# Patient Record
Sex: Female | Born: 1995 | Race: Black or African American | Hispanic: No | Marital: Single | Smoking: Never smoker
Health system: Southern US, Community
[De-identification: ages and names within clinical notes are randomized; demographics above are authoritative.]

## PROBLEM LIST (undated history)

## (undated) ENCOUNTER — Inpatient Hospital Stay (HOSPITAL_COMMUNITY): Payer: Self-pay

## (undated) DIAGNOSIS — Z789 Other specified health status: Secondary | ICD-10-CM

## (undated) DIAGNOSIS — Z9889 Other specified postprocedural states: Secondary | ICD-10-CM

## (undated) DIAGNOSIS — R112 Nausea with vomiting, unspecified: Secondary | ICD-10-CM

## (undated) HISTORY — PX: NO PAST SURGERIES: SHX2092

## (undated) HISTORY — DX: Nausea with vomiting, unspecified: Z98.890

## (undated) HISTORY — DX: Nausea with vomiting, unspecified: R11.2

---
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
## 2016-01-28 ENCOUNTER — Emergency Department (HOSPITAL_COMMUNITY)
Admission: EM | Admit: 2016-01-28 | Discharge: 2016-01-28 | Disposition: A | Discharge status: Home / Self Care | Payer: Medicaid Other | Attending: Emergency Medicine | Admitting: Emergency Medicine

## 2016-01-28 ENCOUNTER — Encounter (HOSPITAL_COMMUNITY): Payer: Self-pay | Admitting: Emergency Medicine

## 2016-01-28 ENCOUNTER — Emergency Department (HOSPITAL_COMMUNITY): Payer: Medicaid Other

## 2016-01-28 DIAGNOSIS — Y929 Unspecified place or not applicable: Secondary | ICD-10-CM | POA: Diagnosis not present

## 2016-01-28 DIAGNOSIS — S6991XA Unspecified injury of right wrist, hand and finger(s), initial encounter: Secondary | ICD-10-CM | POA: Diagnosis not present

## 2016-01-28 DIAGNOSIS — Y939 Activity, unspecified: Secondary | ICD-10-CM | POA: Diagnosis not present

## 2016-01-28 DIAGNOSIS — Y999 Unspecified external cause status: Secondary | ICD-10-CM | POA: Insufficient documentation

## 2016-01-28 MED ORDER — DIAZEPAM 5 MG PO TABS: 5.0000 mg | ORAL_TABLET | Freq: Two times a day (BID) | ORAL | Status: DC

## 2016-01-28 MED ORDER — NAPROXEN 500 MG PO TABS: 500.0000 mg | ORAL_TABLET | Freq: Two times a day (BID) | ORAL | Status: DC

## 2016-01-28 MED ORDER — DIAZEPAM 5 MG PO TABS
5.0000 mg | ORAL_TABLET | Freq: Once | ORAL | Status: AC
  Administered 2016-01-28: 5 mg via ORAL
  Filled 2016-01-28: qty 1

## 2016-01-28 MED ORDER — NAPROXEN 500 MG PO TABS
500.0000 mg | ORAL_TABLET | Freq: Once | ORAL | Status: AC
  Administered 2016-01-28: 500 mg via ORAL
  Filled 2016-01-28: qty 1

## 2016-01-28 NOTE — Discharge Instructions (Signed)
[name],  Nice meeting you! Please follow-up with your primary care provider within 1-2 weeks if you continue to have pain. Return to the emergency department if you develop numbness/tingling, inability to move your finger, new/worsening symptoms. Feel better soon!  S. Lane HackerNicole Berneice Zettlemoyer, PA-C
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
## 2016-05-28 ENCOUNTER — Encounter (HOSPITAL_COMMUNITY): Payer: Self-pay | Admitting: Emergency Medicine

## 2016-05-28 ENCOUNTER — Emergency Department (HOSPITAL_COMMUNITY): Payer: Medicaid Other

## 2016-05-28 ENCOUNTER — Emergency Department (HOSPITAL_COMMUNITY)
Admission: EM | Admit: 2016-05-28 | Discharge: 2016-05-28 | Disposition: A | Discharge status: Home / Self Care | Payer: Medicaid Other | Attending: Emergency Medicine | Admitting: Emergency Medicine

## 2016-05-28 DIAGNOSIS — Y990 Civilian activity done for income or pay: Secondary | ICD-10-CM | POA: Insufficient documentation

## 2016-05-28 DIAGNOSIS — M79645 Pain in left finger(s): Secondary | ICD-10-CM | POA: Insufficient documentation

## 2016-05-28 DIAGNOSIS — Y939 Activity, unspecified: Secondary | ICD-10-CM | POA: Diagnosis not present

## 2016-05-28 DIAGNOSIS — X509XXA Other and unspecified overexertion or strenuous movements or postures, initial encounter: Secondary | ICD-10-CM | POA: Diagnosis not present

## 2016-05-28 DIAGNOSIS — Y929 Unspecified place or not applicable: Secondary | ICD-10-CM | POA: Diagnosis not present

## 2016-05-28 DIAGNOSIS — G8929 Other chronic pain: Secondary | ICD-10-CM | POA: Diagnosis not present

## 2016-05-28 DIAGNOSIS — M79644 Pain in right finger(s): Secondary | ICD-10-CM

## 2016-05-28 MED ORDER — IBUPROFEN 200 MG PO TABS
600.0000 mg | ORAL_TABLET | Freq: Once | ORAL | Status: AC
  Administered 2016-05-28: 600 mg via ORAL
  Filled 2016-05-28: qty 3

## 2016-05-28 NOTE — Discharge Instructions (Signed)
Take Ibuprofen 600mg  three times daily Follow up with hand surgeon to evaluate

## 2016-05-28 NOTE — ED Provider Notes (Signed)
[facility]-EMERGENCY DEPT Provider Note   CSN: [id number] Arrival date & time: 05/28/16  1750  By signing my name below, I, Mackenzie Lambert, attest that this documentation has been prepared under the direction and in the presence of non-physician practitioner, Terance HartKelly Hervey Wedig, PA-C. Electronically Signed: Modena JanskyAlbert Lambert, Scribe. 05/28/2016. 8:09 PM.  History   Chief Complaint Chief Complaint  Patient presents with  . thumb pain    left    The history is provided by the patient. No language interpreter was used.   HPI Comments: Mackenzie Lambert is a 20 y.o. female who presents to the Emergency Department complaining of left thumb pain that started today. She states she had a splint for a previous injury in May and was instructed to keep it on for 2 weeks however only worse it for 4 days at that time since she had to work. Over the past several months she has had chronic left thumb pain exacerbated by her job which involves lifting. Today she experienced acute pain after on object at work fell which she tried to catch and it pushed her thumb backwards. She describes the pain as a throbbing sensation that is relieved some by tylenol. She is right handed. Denies any other complaints.   Past Medical History:  Diagnosis Date  . Medical history non-contributory     There are no active problems to display for this patient.   Past Surgical History:  Procedure Laterality Date  . NO PAST SURGERIES      OB History    Gravida Para Term Preterm AB Living   1             SAB TAB Ectopic Multiple Live Births                   Home Medications    Prior to Admission medications   Medication Sig Start Date End Date Taking? Authorizing Provider  diazepam (VALIUM) 5 MG tablet Take 1 tablet (5 mg total) by mouth 2 (two) times daily. 01/28/16   Melton KrebsSamantha Nicole Riley, PA-C  naproxen (NAPROSYN) 500 MG tablet Take 1 tablet (500 mg total) by mouth 2 (two) times daily. 01/28/16   Melton KrebsSamantha Nicole Riley, PA-C    norgestimate-ethinyl estradiol (ORTHO-CYCLEN,SPRINTEC,PREVIFEM) 0.25-35 MG-MCG tablet Take 1 tablet by mouth daily. 08/21/15   Judeth HornErin Lawrence, NP    Family History No family history on file.  Social History Social History  Substance Use Topics  . Smoking status: Never Smoker  . Smokeless tobacco: Never Used  . Alcohol use No     Allergies   Review of patient's allergies indicates no known allergies.   Review of Systems Review of Systems  Constitutional: Negative for fever.  Musculoskeletal: Positive for arthralgias. Myalgias: Left thumb.     Physical Exam Updated Vital Signs BP 106/67 (BP Location: Left Arm)   Pulse 85   Temp 98 F (36.7 C) (Oral)   Resp 13   Ht 5\' 4"  (1.626 m)   Wt 117 lb (53.1 kg)   LMP 04/16/2016   SpO2 99%   BMI 20.08 kg/m   Physical Exam  Constitutional: She is oriented to person, place, and time. She appears well-developed and well-nourished. No distress.  HENT:  Head: Normocephalic and atraumatic.  Eyes: Conjunctivae are normal. Pupils are equal, round, and reactive to light. Right eye exhibits no discharge. Left eye exhibits no discharge. No scleral icterus.  Neck: Normal range of motion. Neck supple.  Cardiovascular: Normal rate and regular rhythm.  No murmur heard. Pulmonary/Chest: Effort normal and breath sounds normal. No respiratory distress.  Abdominal: Soft. She exhibits no distension. There is no tenderness.  Musculoskeletal: She exhibits no edema.  Left hand: No obvious swelling or deformity. Holding her thumb in flexion. Significant tenderness to palpation with ROM of thumb. N/V intact.   Neurological: She is alert and oriented to person, place, and time.  Skin: Skin is warm and dry.  Psychiatric: She has a normal mood and affect. Her behavior is normal.  Nursing note and vitals reviewed.    ED Treatments / Results  DIAGNOSTIC STUDIES: Oxygen Saturation is 99% on RA, normal by my interpretation.    COORDINATION OF  CARE: 8:14 PM- Pt advised of plan for treatment and pt agrees.  Labs (all labs ordered are listed, but only abnormal results are displayed) Labs Reviewed - No data to display  EKG  EKG Interpretation None       Radiology Dg Finger Thumb Right  Result Date: 05/28/2016 CLINICAL DATA:  Pt reports second injury on left thumb, sts was given splint previous injury to keep 2 weeks, yet was taken off after 4 days, toady she sts injured again picking an object up at work. Unable to move left thumb, mild edema noted. EXAM: RIGHT THUMB 2+V COMPARISON:  01/28/2016 FINDINGS: No acute fracture. There is a concave depression along the dorsal radial aspect of the first metacarpal head which was not present on the prior exam. This may reflect the sequela of a subacute to older avulsion fracture. The joints are normally spaced and aligned. Soft tissues are unremarkable. IMPRESSION: 1. Small defect noted along the dorsal radial aspect of the first metacarpal head. Suspect a subacute to early chronic avulsion fracture, which was not evident on the prior radiographs. Electronically Signed   By: Amie Portland M.D.   On: 05/28/2016 19:37    Procedures Procedures (including critical care time)  Medications Ordered in ED Medications  ibuprofen (ADVIL,MOTRIN) tablet 600 mg (600 mg Oral Given 05/28/16 2022)    Initial Impression / Assessment and Plan / ED Course  I have reviewed the triage vital signs and the nursing notes.  Pertinent labs & imaging results that were available during my care of the patient were reviewed by me and considered in my medical decision making (see chart for details).  Clinical Course   20 year old female presents with left thumb pain after acute injury. Xray negative for acute fracture but does show possible chronic avulsion fx. Will treat for ulnar collateral ligament sprain. Patient placed in thumb spica and will have her follow up with Ortho. Pain treated in ED. Recommend course  of NSAIDs. Patient is NAD, non-toxic, with stable VS. Patient is informed of clinical course, understands medical decision making process, and agrees with plan. Opportunity for questions provided and all questions answered. Return precautions given.   Final Clinical Impressions(s) / ED Diagnoses   Final diagnoses:  Thumb pain, right    New Prescriptions Discharge Medication List as of 05/28/2016  9:05 PM     I personally performed the services described in this documentation, which was scribed in my presence. The recorded information has been reviewed and is accurate.     Bethel Born, PA-C 06/01/16 1742    Pricilla Loveless, MD 06/02/16 [phone]

## 2016-05-28 NOTE — ED Triage Notes (Signed)
Pt reports second injury on left thumb, sts was given splint  previous injury to keep 2 weeks, yet was taken off after 4 days, toady she sts injured again. Unable to move left thumb, mild edema noted.

## 2016-05-28 NOTE — ED Notes (Signed)
Patient was alert, oriented and stable upon discharge. RN went over AVS and patient had no further questions.  

## 2016-06-06 ENCOUNTER — Other Ambulatory Visit: Payer: Self-pay | Admitting: Orthopedic Surgery

## 2016-06-06 DIAGNOSIS — S6991XA Unspecified injury of right wrist, hand and finger(s), initial encounter: Secondary | ICD-10-CM

## 2016-06-25 ENCOUNTER — Other Ambulatory Visit: Payer: Medicaid Other

## 2017-02-07 ENCOUNTER — Inpatient Hospital Stay (HOSPITAL_COMMUNITY)
Admission: AD | Admit: 2017-02-07 | Discharge: 2017-02-07 | Disposition: A | Discharge status: Home / Self Care | Payer: Medicaid Other | Source: Ambulatory Visit | Attending: Obstetrics & Gynecology | Admitting: Obstetrics & Gynecology

## 2017-02-07 ENCOUNTER — Encounter (HOSPITAL_COMMUNITY): Payer: Self-pay

## 2017-02-07 DIAGNOSIS — N760 Acute vaginitis: Secondary | ICD-10-CM | POA: Insufficient documentation

## 2017-02-07 DIAGNOSIS — N926 Irregular menstruation, unspecified: Secondary | ICD-10-CM | POA: Insufficient documentation

## 2017-02-07 DIAGNOSIS — B9689 Other specified bacterial agents as the cause of diseases classified elsewhere: Secondary | ICD-10-CM | POA: Diagnosis not present

## 2017-02-07 LAB — URINALYSIS, ROUTINE W REFLEX MICROSCOPIC
Bilirubin Urine: NEGATIVE
Glucose, UA: NEGATIVE mg/dL
HGB URINE DIPSTICK: NEGATIVE
Ketones, ur: NEGATIVE mg/dL
Leukocytes, UA: NEGATIVE
Nitrite: NEGATIVE
PH: 6 (ref 5.0–8.0)
Protein, ur: NEGATIVE mg/dL
SPECIFIC GRAVITY, URINE: 1.015 (ref 1.005–1.030)

## 2017-02-07 LAB — WET PREP, GENITAL
SPERM: NONE SEEN
Trich, Wet Prep: NONE SEEN
YEAST WET PREP: NONE SEEN

## 2017-02-07 LAB — POCT PREGNANCY, URINE: PREG TEST UR: NEGATIVE

## 2017-02-07 MED ORDER — METRONIDAZOLE 500 MG PO TABS: 500.0000 mg | ORAL_TABLET | Freq: Two times a day (BID) | ORAL | 0 refills | Status: DC

## 2017-02-07 NOTE — MAU Provider Note (Signed)
History     CSN: [id number]  Arrival date and time: 02/07/17 [id number]   First Provider Initiated Contact with Patient 02/07/17 0920      Chief Complaint  Patient presents with  . Menstrual Problem   G2P0020 Non-pregnant female here with irregular menses and vaginal discharge. Vaginal discharge occurred last month during the menses and was green color w/o odor. No new partner in 1 year. Not using contraception would like to get pregnant again but is unsure if it is safe d/t her hx. Reports miscarriage last year when she was 5 months, started bleeding, passed fetus in toilet, didn't tell anyone except her boyfriend. Irregular menses have ensued since. Some months she bleeds two times and other months she skips her period. Before the miscarriage she had cramping just before menses started but now she no longer has this sx. She wants to make sure she is still able to conceive.    Pertinent Gynecological History: Menses: 01/23/17 Bleeding: dysfunctional uterine bleeding Contraception: none Sexually transmitted diseases: no past history   Past Medical History:  Diagnosis Date  . Medical history non-contributory     Past Surgical History:  Procedure Laterality Date  . NO PAST SURGERIES      History reviewed. No pertinent family history.  Social History  Substance Use Topics  . Smoking status: Never Smoker  . Smokeless tobacco: Never Used  . Alcohol use No    Allergies: No Known Allergies  No prescriptions prior to admission.    Review of Systems  Gastrointestinal: Negative for abdominal pain.  Genitourinary: Positive for vaginal discharge.   Physical Exam   Blood pressure 129/83, pulse 91, temperature 98.8 F (37.1 C), temperature source Oral, resp. rate 18, height 5\' 1"  (1.549 m), weight 51.3 kg (113 lb), last menstrual period 01/23/2017, SpO2 99 %.  Physical Exam  Constitutional: She is oriented to person, place, and time. She appears well-developed and well-nourished.  No distress.  HENT:  Head: Normocephalic and atraumatic.  Neck: Normal range of motion.  Respiratory: Effort normal. No respiratory distress.  GI: Soft. She exhibits no distension and no mass. There is no tenderness. There is no rebound and no guarding.  Genitourinary:  Genitourinary Comments: External: no lesions or erythema Vagina: rugated, pink, moist, thin white frothy discharge Uterus: non enlarged, anteverted, non tender, no CMT Adnexae: no masses, no tenderness left, no tenderness right   Musculoskeletal: Normal range of motion.  Neurological: She is alert and oriented to person, place, and time.  Skin: Skin is warm and dry.  Psychiatric: She has a normal mood and affect.   Results for orders placed or performed during the hospital encounter of 02/07/17 (from the past 24 hour(s))  Urinalysis, Routine w reflex microscopic     Status: None   Collection Time: 02/07/17  8:41 AM  Result Value Ref Range   Color, Urine YELLOW YELLOW   APPearance CLEAR CLEAR   Specific Gravity, Urine 1.015 1.005 - 1.030   pH 6.0 5.0 - 8.0   Glucose, UA NEGATIVE NEGATIVE mg/dL   Hgb urine dipstick NEGATIVE NEGATIVE   Bilirubin Urine NEGATIVE NEGATIVE   Ketones, ur NEGATIVE NEGATIVE mg/dL   Protein, ur NEGATIVE NEGATIVE mg/dL   Nitrite NEGATIVE NEGATIVE   Leukocytes, UA NEGATIVE NEGATIVE  Pregnancy, urine POC     Status: None   Collection Time: 02/07/17  8:59 AM  Result Value Ref Range   Preg Test, Ur NEGATIVE NEGATIVE  Wet prep, genital     Status: Abnormal  Collection Time: 02/07/17  9:35 AM  Result Value Ref Range   Yeast Wet Prep HPF POC NONE SEEN NONE SEEN   Trich, Wet Prep NONE SEEN NONE SEEN   Clue Cells Wet Prep HPF POC PRESENT (A) NONE SEEN   WBC, Wet Prep HPF POC FEW (A) NONE SEEN   Sperm NONE SEEN    MAU Course  Procedures  MDM Labs ordered and reviewed. No evidence of pregnancy. Recommend outpt f/u to establish care and if AUB persists. Start PNV daily-explained rationale.  Explained to pt that based on today's exam I don't see any contraindication to attempting pregnancy again. It's unclear why she had a second trimester loss as there could be multiple causes and it's unclear her gestational age at the time of the loss as she cannot recall when the miscarriage occurred. Will treat BV. She is stable for discharge home.   Assessment and Plan   1. Bacterial vaginosis   2. Irregular menses    Discharge home Follow up with OBGYN provider of choice as needed-list provided Rx Flagyl Start PNV 1 po daily  Allergies as of 02/07/2017   No Known Allergies     Medication List    TAKE these medications   metroNIDAZOLE 500 MG tablet Commonly known as:  FLAGYL Take 1 tablet (500 mg total) by mouth 2 (two) times daily.      Donette Larry, CNM 02/07/2017, 9:26 AM

## 2017-02-07 NOTE — Discharge Instructions (Signed)
Bacterial Vaginosis Bacterial vaginosis is a vaginal infection that occurs when the normal balance of bacteria in the vagina is disrupted. It results from an overgrowth of certain bacteria. This is the most common vaginal infection among women ages 15-44. Because bacterial vaginosis increases your risk for STIs (sexually transmitted infections), getting treated can help reduce your risk for chlamydia, gonorrhea, herpes, and HIV (human immunodeficiency virus). Treatment is also important for preventing complications in pregnant women, because this condition can cause an early (premature) delivery. What are the causes? This condition is caused by an increase in harmful bacteria that are normally present in small amounts in the vagina. However, the reason that the condition develops is not fully understood. What increases the risk? The following factors may make you more likely to develop this condition:  Having a new sexual partner or multiple sexual partners.  Having unprotected sex.  Douching.  Having an intrauterine device (IUD).  Smoking.  Drug and alcohol abuse.  Taking certain antibiotic medicines.  Being pregnant.  You cannot get bacterial vaginosis from toilet seats, bedding, swimming pools, or contact with objects around you. What are the signs or symptoms? Symptoms of this condition include:  Grey or white vaginal discharge. The discharge can also be watery or foamy.  A fish-like odor with discharge, especially after sexual intercourse or during menstruation.  Itching in and around the vagina.  Burning or pain with urination.  Some women with bacterial vaginosis have no signs or symptoms. How is this diagnosed? This condition is diagnosed based on:  Your medical history.  A physical exam of the vagina.  Testing a sample of vaginal fluid under a microscope to look for a large amount of bad bacteria or abnormal cells. Your health care provider may use a cotton swab  or a small wooden spatula to collect the sample.  How is this treated? This condition is treated with antibiotics. These may be given as a pill, a vaginal cream, or a medicine that is put into the vagina (suppository). If the condition comes back after treatment, a second round of antibiotics may be needed. Follow these instructions at home: Medicines  Take over-the-counter and prescription medicines only as told by your health care provider.  Take or use your antibiotic as told by your health care provider. Do not stop taking or using the antibiotic even if you start to feel better. General instructions  If you have a female sexual partner, tell her that you have a vaginal infection. She should see her health care provider and be treated if she has symptoms. If you have a female sexual partner, he does not need treatment.  During treatment: ? Avoid sexual activity until you finish treatment. ? Do not douche. ? Avoid alcohol as directed by your health care provider. ? Avoid breastfeeding as directed by your health care provider.  Drink enough water and fluids to keep your urine clear or pale yellow.  Keep the area around your vagina and rectum clean. ? Wash the area daily with warm water. ? Wipe yourself from front to back after using the toilet.  Keep all follow-up visits as told by your health care provider. This is important. How is this prevented?  Do not douche.  Wash the outside of your vagina with warm water only.  Use protection when having sex. This includes latex condoms and dental dams.  Limit how many sexual partners you have. To help prevent bacterial vaginosis, it is best to have sex with just   one partner (monogamous).  Make sure you and your sexual partner are tested for STIs.  Wear cotton or cotton-lined underwear.  Avoid wearing tight pants and pantyhose, especially during summer.  Limit the amount of alcohol that you drink.  Do not use any products that  contain nicotine or tobacco, such as cigarettes and e-cigarettes. If you need help quitting, ask your health care provider.  Do not use illegal drugs. Where to find more information:  Centers for Disease Control and Prevention: www.cdc.gov/std  American Sexual Health Association (ASHA): www.ashastd.org  [place].S. Department of Health and Human Services, Office on Women's Health: www.womenshealth.gov/ or https://www.womenshealth.gov/a-z-topics/bacterial-vaginosis Contact a health care provider if:  Your symptoms do not improve, even after treatment.  You have more discharge or pain when urinating.  You have a fever.  You have pain in your abdomen.  You have pain during sex.  You have vaginal bleeding between periods. Summary  Bacterial vaginosis is a vaginal infection that occurs when the normal balance of bacteria in the vagina is disrupted.  Because bacterial vaginosis increases your risk for STIs (sexually transmitted infections), getting treated can help reduce your risk for chlamydia, gonorrhea, herpes, and HIV (human immunodeficiency virus). Treatment is also important for preventing complications in pregnant women, because the condition can cause an early (premature) delivery.  This condition is treated with antibiotic medicines. These may be given as a pill, a vaginal cream, or a medicine that is put into the vagina (suppository). This information is not intended to replace advice given to you by your health care provider. Make sure you discuss any questions you have with your health care provider. Document Released: 08/18/2005 Document Revised: 05/03/2016 Document Reviewed: 05/03/2016 Elsevier Interactive Patient Education  2017 [employer]  

## 2017-02-07 NOTE — MAU Note (Signed)
Pt states 6 months ago she had a miscarriage when she was around 5 months pregnant. Pt states after that happened she felt depressed and didn't go back to the doctor. Pt states since that time her period has been a lot shorter than normal. Pt states her period used to be 7 days long and now it is 3 days. Pt states her period has also been irregular. One month she had two periods and sometimes it is longer than normal between periods. Pt denies pain.

## 2017-02-09 LAB — GC/CHLAMYDIA PROBE AMP (~~LOC~~) NOT AT ARMC
CHLAMYDIA, DNA PROBE: NEGATIVE
Neisseria Gonorrhea: NEGATIVE

## 2017-02-22 ENCOUNTER — Inpatient Hospital Stay (HOSPITAL_COMMUNITY)
Admission: AD | Admit: 2017-02-22 | Discharge: 2017-02-22 | Disposition: A | Discharge status: Home / Self Care | Payer: Medicaid Other | Source: Ambulatory Visit | Attending: Obstetrics & Gynecology | Admitting: Obstetrics & Gynecology

## 2017-02-22 ENCOUNTER — Encounter (HOSPITAL_COMMUNITY): Payer: Self-pay | Admitting: *Deleted

## 2017-02-22 DIAGNOSIS — N911 Secondary amenorrhea: Secondary | ICD-10-CM

## 2017-02-22 DIAGNOSIS — N912 Amenorrhea, unspecified: Secondary | ICD-10-CM | POA: Diagnosis not present

## 2017-02-22 NOTE — Discharge Instructions (Signed)

## 2017-02-22 NOTE — MAU Provider Note (Signed)
[name] is a 21 y.o. G2P0020 at Unknown who presents to MAU today for pregnancy verification. The patient denies abdominal pain or vaginal bleeding today. Pos home UPT.  BP 112/60 (BP Location: Right Arm)   Pulse 86   Temp 98.5 F (36.9 C) (Oral)   Resp 16   Wt 114 lb 12 oz (52.1 kg)   LMP 01/23/2017 (Exact Date)   SpO2 100%   BMI 21.68 kg/m   CONSTITUTIONAL: Well-developed, well-nourished female in no acute distress.  CARDIOVASCULAR: Regular heart rate RESPIRATORY: Normal effort NEUROLOGICAL: Alert and oriented to person, place, and time.  SKIN: Skin is warm and dry. No rash noted. Not diaphoretic. No erythema. No pallor. PSYCH: Normal mood and affect. Normal behavior. Normal judgment and thought content.  MDM Medical Screen Exam Complete  A: Amenorrhea  P: Discharge from MAU Patient advised to follow-up with [facility] for pregnancy confirmation  Monday-Thursday 8am-4pm or Friday 8am-11am Patient may return to MAU as needed or if her condition were to change or worsen   Katrinka BlazingSmith, [place], [place]  02/22/2017 12:57 PM

## 2017-02-22 NOTE — MAU Note (Signed)
Was here on 6/9, had 2 periods in May, ? Spotting on the 25th.  Has a period tracker, said my period was late, did a HPT and it was +. Wanting to know if she is pregnant. Has been cramping, felt like period was going to start and it hasn't.  No pain today.

## 2017-02-24 ENCOUNTER — Inpatient Hospital Stay (HOSPITAL_COMMUNITY)
Admission: AD | Admit: 2017-02-24 | Discharge: 2017-02-24 | Disposition: A | Discharge status: Home / Self Care | Payer: Medicaid Other | Source: Ambulatory Visit | Attending: Family Medicine | Admitting: Family Medicine

## 2017-02-24 ENCOUNTER — Inpatient Hospital Stay (HOSPITAL_COMMUNITY): Payer: Medicaid Other

## 2017-02-24 ENCOUNTER — Encounter: Payer: Self-pay | Admitting: Medical

## 2017-02-24 DIAGNOSIS — M545 Low back pain: Secondary | ICD-10-CM | POA: Insufficient documentation

## 2017-02-24 DIAGNOSIS — O9989 Other specified diseases and conditions complicating pregnancy, childbirth and the puerperium: Secondary | ICD-10-CM

## 2017-02-24 DIAGNOSIS — R109 Unspecified abdominal pain: Secondary | ICD-10-CM | POA: Diagnosis not present

## 2017-02-24 DIAGNOSIS — R1030 Lower abdominal pain, unspecified: Secondary | ICD-10-CM | POA: Diagnosis not present

## 2017-02-24 DIAGNOSIS — O3680X0 Pregnancy with inconclusive fetal viability, not applicable or unspecified: Secondary | ICD-10-CM

## 2017-02-24 DIAGNOSIS — O26891 Other specified pregnancy related conditions, first trimester: Secondary | ICD-10-CM | POA: Diagnosis not present

## 2017-02-24 DIAGNOSIS — O99891 Other specified diseases and conditions complicating pregnancy: Secondary | ICD-10-CM

## 2017-02-24 DIAGNOSIS — Z3A01 Less than 8 weeks gestation of pregnancy: Secondary | ICD-10-CM | POA: Insufficient documentation

## 2017-02-24 DIAGNOSIS — M549 Dorsalgia, unspecified: Secondary | ICD-10-CM

## 2017-02-24 LAB — WET PREP, GENITAL
SPERM: NONE SEEN
TRICH WET PREP: NONE SEEN
YEAST WET PREP: NONE SEEN

## 2017-02-24 LAB — URINALYSIS, ROUTINE W REFLEX MICROSCOPIC
Bilirubin Urine: NEGATIVE
GLUCOSE, UA: NEGATIVE mg/dL
Hgb urine dipstick: NEGATIVE
KETONES UR: 5 mg/dL — AB
LEUKOCYTES UA: NEGATIVE
Nitrite: NEGATIVE
PH: 5 (ref 5.0–8.0)
PROTEIN: NEGATIVE mg/dL
Specific Gravity, Urine: 1.02 (ref 1.005–1.030)

## 2017-02-24 LAB — CBC
HEMATOCRIT: 42 % (ref 36.0–46.0)
Hemoglobin: 13.7 g/dL (ref 12.0–15.0)
MCH: 29.4 pg (ref 26.0–34.0)
MCHC: 32.6 g/dL (ref 30.0–36.0)
MCV: 90.1 fL (ref 78.0–100.0)
Platelets: 291 10*3/uL (ref 150–400)
RBC: 4.66 MIL/uL (ref 3.87–5.11)
RDW: 13 % (ref 11.5–15.5)
WBC: 9.8 10*3/uL (ref 4.0–10.5)

## 2017-02-24 LAB — HCG, QUANTITATIVE, PREGNANCY: hCG, Beta Chain, Quant, S: 298 m[IU]/mL — ABNORMAL HIGH (ref <5)

## 2017-02-24 LAB — POCT PREGNANCY, URINE: Preg Test, Ur: POSITIVE — AB

## 2017-02-24 NOTE — MAU Provider Note (Signed)
History     CSN: [id number]  Arrival date and time: 02/24/17 1639   None     Chief Complaint  Patient presents with  . Back Pain  . Abdominal Pain   HPI [name] is a 21 y.o. G3P0020 at [redacted]w[redacted]d who presents to MAU today with complaint of back pain and lower abdominal pain. Pain started this week and has been intermittent. The patient denies vaginal bleeding, discharge, UTI symptoms or N/V/D. She has not taken anything for pain.   OB History    Gravida Para Term Preterm AB Living   3       2     SAB TAB Ectopic Multiple Live Births   2       0      Past Medical History:  Diagnosis Date  . Medical history non-contributory     Past Surgical History:  Procedure Laterality Date  . NO PAST SURGERIES      No family history on file.  Social History  Substance Use Topics  . Smoking status: Never Smoker  . Smokeless tobacco: Never Used  . Alcohol use No    Allergies: No Known Allergies  No prescriptions prior to admission.    Review of Systems  Constitutional: Negative for fever.  Gastrointestinal: Positive for abdominal pain. Negative for constipation, diarrhea, nausea and vomiting.  Genitourinary: Negative for dysuria, frequency, urgency, vaginal bleeding and vaginal discharge.  Musculoskeletal: Positive for back pain.   Physical Exam   Blood pressure (!) 120/58, pulse 83, temperature 98.7 F (37.1 C), temperature source Oral, resp. rate 17, height 5\' 1"  (1.549 m), weight 115 lb (52.2 kg), last menstrual period 01/23/2017.  Physical Exam  Nursing note and vitals reviewed. Constitutional: She is oriented to person, place, and time. She appears well-developed and well-nourished. No distress.  HENT:  Head: Normocephalic and atraumatic.  Cardiovascular: Normal rate.   Respiratory: Effort normal.  GI: Soft. She exhibits no distension and no mass. There is no tenderness. There is no rebound and no guarding.  Genitourinary: Uterus is not enlarged and  not tender. Cervix exhibits no motion tenderness and no discharge. Right adnexum displays no mass and no tenderness. Left adnexum displays no mass and no tenderness. No bleeding in the vagina. No vaginal discharge found.  Neurological: She is alert and oriented to person, place, and time.  Skin: Skin is warm and dry. No erythema.  Psychiatric: She has a normal mood and affect.    Results for orders placed or performed during the hospital encounter of 02/24/17 (from the past 24 hour(s))  Urinalysis, Routine w reflex microscopic     Status: Abnormal   Collection Time: 02/24/17  5:20 PM  Result Value Ref Range   Color, Urine YELLOW YELLOW   APPearance CLEAR CLEAR   Specific Gravity, Urine 1.020 1.005 - 1.030   pH 5.0 5.0 - 8.0   Glucose, UA NEGATIVE NEGATIVE mg/dL   Hgb urine dipstick NEGATIVE NEGATIVE   Bilirubin Urine NEGATIVE NEGATIVE   Ketones, ur 5 (A) NEGATIVE mg/dL   Protein, ur NEGATIVE NEGATIVE mg/dL   Nitrite NEGATIVE NEGATIVE   Leukocytes, UA NEGATIVE NEGATIVE  Pregnancy, urine POC     Status: Abnormal   Collection Time: 02/24/17  5:33 PM  Result Value Ref Range   Preg Test, Ur POSITIVE (A) NEGATIVE  CBC     Status: None   Collection Time: 02/24/17  7:56 PM  Result Value Ref Range   WBC 9.8 4.0 -  10.5 K/uL   RBC 4.66 3.87 - 5.11 MIL/uL   Hemoglobin 13.7 12.0 - 15.0 g/dL   HCT 16.142.0 09.636.0 - 04.546.0 %   MCV 90.1 78.0 - 100.0 fL   MCH 29.4 26.0 - 34.0 pg   MCHC 32.6 30.0 - 36.0 g/dL   RDW 40.913.0 81.111.5 - 91.415.5 %   Platelets 291 150 - 400 K/uL  hCG, quantitative, pregnancy     Status: Abnormal   Collection Time: 02/24/17  7:56 PM  Result Value Ref Range   hCG, Beta Chain, Quant, S 298 (H) <5 mIU/mL  Wet prep, genital     Status: Abnormal   Collection Time: 02/24/17 11:10 PM  Result Value Ref Range   Yeast Wet Prep HPF POC NONE SEEN NONE SEEN   Trich, Wet Prep NONE SEEN NONE SEEN   Clue Cells Wet Prep HPF POC PRESENT (A) NONE SEEN   WBC, Wet Prep HPF POC FEW (A) NONE SEEN    Sperm NONE SEEN    [place] Ob Comp Less 14 Wks  Addendum Date: 02/24/2017   ADDENDUM REPORT: 02/24/2017 23:26 ADDENDUM: Dictation error with correction to section "FINDINGS" as follows: Intrauterine gestational sac: None Yolk sac:  None Embryo:  None Cardiac Activity:  N/A Electronically Signed   By: Mitzi HansenLance  Furusawa-Stratton M.D.   On: 02/24/2017 23:26   Result Date: 02/24/2017 CLINICAL DATA:  Right lower back pain beginning at work today. EXAM: OBSTETRIC <14 WK US AND TRANSVAGINAL OB US TECHNIQUE: Both transabdominal and transvaginal ultrasound examinations were performed for complete evaluation of the gestation as well as the maternal uterus, adnexal regions, and pelvic cul-de-sac. Transvaginal technique was performed to assess early pregnancy. COMPARISON:  05/08/2015 pelvic ultrasound FINDINGS: Intrauterine gestational sac: None Yolk sac:  Visualized. Embryo:  Visualized. Cardiac Activity: Visualized. Maternal uterus/adnexae: Endometrium is thickened to 12 mm and mildly heterogeneous. Bilateral ovaries are normal in appearance. Right ovary corpus luteum cyst. No adnexal mass identified. Other: There is a small volume of anechoic simple fluid within the pelvis, probably physiologic. IMPRESSION: Pregnancy of unknown location. Intrauterine pregnancy may not be identified with a beta HCG of less than 3,000. Follow-up beta HCG and possible pelvic ultrasound as clinically indicated is recommended to evaluate pregnancy location. This recommendation follows SRU consensus guidelines: Diagnostic Criteria for Nonviable Pregnancy Early in the First Trimester. Malva Limes Engl J Med 2013; 782:9562-13; 369:1443-51. Electronically Signed: By: Mitzi HansenLance  Furusawa-Stratton M.D. On: 02/24/2017 23:12   [place] Ob Transvaginal  Addendum Date: 02/24/2017   ADDENDUM REPORT: 02/24/2017 23:26 ADDENDUM: Dictation error with correction to section "FINDINGS" as follows: Intrauterine gestational sac: None Yolk sac:  None Embryo:  None Cardiac Activity:  N/A  Electronically Signed   By: Mitzi HansenLance  Furusawa-Stratton M.D.   On: 02/24/2017 23:26   Result Date: 02/24/2017 CLINICAL DATA:  Right lower back pain beginning at work today. EXAM: OBSTETRIC <14 WK US AND TRANSVAGINAL OB US TECHNIQUE: Both transabdominal and transvaginal ultrasound examinations were performed for complete evaluation of the gestation as well as the maternal uterus, adnexal regions, and pelvic cul-de-sac. Transvaginal technique was performed to assess early pregnancy. COMPARISON:  05/08/2015 pelvic ultrasound FINDINGS: Intrauterine gestational sac: None Yolk sac:  Visualized. Embryo:  Visualized. Cardiac Activity: Visualized. Maternal uterus/adnexae: Endometrium is thickened to 12 mm and mildly heterogeneous. Bilateral ovaries are normal in appearance. Right ovary corpus luteum cyst. No adnexal mass identified. Other: There is a small volume of anechoic simple fluid within the pelvis, probably physiologic. IMPRESSION: Pregnancy of unknown location. Intrauterine pregnancy may not  be identified with a beta HCG of less than 3,000. Follow-up beta HCG and possible pelvic ultrasound as clinically indicated is recommended to evaluate pregnancy location. This recommendation follows SRU consensus guidelines: Diagnostic Criteria for Nonviable Pregnancy Early in the First Trimester. Malva Limes Med 2013; 562:1308-65. Electronically Signed: By: Mitzi Hansen M.D. On: 02/24/2017 23:12    MAU Course  Procedures None  MDM +UPT UA, wet prep, GC/chlamydia, CBC, ABO/Rh, quant hCG, HIV, RPR and [place] today to rule out ectopic pregnancy B+ blood type in Epic from previous visit  Assessment and Plan  A: Pregnancy of unknown location Abdominal pain in pregnancy, first trimester  P: Discharge home Ectopic precautions discussed Patient advised to follow-up with [facility] on Friday at 8:00 am for repeat hCG  Patient may return to MAU as needed or if her condition were to change or worsen   Vonzella Nipple,  PA-C 02/25/2017, 1:04 AM

## 2017-02-24 NOTE — MAU Note (Signed)
Pt started having back pain at work today around 1500.  Went to health & safety dept @ work but they told her they couldn't treat her because she is pregnant.  Having some lower abd pain as well, denies bleeding.  Pos HPT 2 days ago.

## 2017-02-24 NOTE — Discharge Instructions (Signed)
First Trimester of Pregnancy The first trimester of pregnancy is from week 1 until the end of week 13 (months 1 through 3). During this time, your baby will begin to develop inside you. At 6-8 weeks, the eyes and face are formed, and the heartbeat can be seen on ultrasound. At the end of 12 weeks, all the baby's organs are formed. Prenatal care is all the medical care you receive before the birth of your baby. Make sure you get good prenatal care and follow all of your doctor's instructions. Follow these instructions at home: Medicines  Take over-the-counter and prescription medicines only as told by your doctor. Some medicines are safe and some medicines are not safe during pregnancy.  Take a prenatal vitamin that contains at least 600 micrograms (mcg) of folic acid.  If you have trouble pooping (constipation), take medicine that will make your stool soft (stool softener) if your doctor approves. Eating and drinking  Eat regular, healthy meals.  Your doctor will tell you the amount of weight gain that is right for you.  Avoid raw meat and uncooked cheese.  If you feel sick to your stomach (nauseous) or throw up (vomit): ? Eat 4 or 5 small meals a day instead of 3 large meals. ? Try eating a few soda crackers. ? Drink liquids between meals instead of during meals.  To prevent constipation: ? Eat foods that are high in fiber, like fresh fruits and vegetables, whole grains, and beans. ? Drink enough fluids to keep your pee (urine) clear or pale yellow. Activity  Exercise only as told by your doctor. Stop exercising if you have cramps or pain in your lower belly (abdomen) or low back.  Do not exercise if it is too hot, too humid, or if you are in a place of great height (high altitude).  Try to avoid standing for long periods of time. Move your legs often if you must stand in one place for a long time.  Avoid heavy lifting.  Wear low-heeled shoes. Sit and stand up straight.  You  can have sex unless your doctor tells you not to. Relieving pain and discomfort  Wear a good support bra if your breasts are sore.  Take warm water baths (sitz baths) to soothe pain or discomfort caused by hemorrhoids. Use hemorrhoid cream if your doctor says it is okay.  Rest with your legs raised if you have leg cramps or low back pain.  If you have puffy, bulging veins (varicose veins) in your legs: ? Wear support hose or compression stockings as told by your doctor. ? Raise (elevate) your feet for 15 minutes, 3-4 times a day. ? Limit salt in your food. Prenatal care  Schedule your prenatal visits by the twelfth week of pregnancy.  Write down your questions. Take them to your prenatal visits.  Keep all your prenatal visits as told by your doctor. This is important. Safety  Wear your seat belt at all times when driving.  Make a list of emergency phone numbers. The list should include numbers for family, friends, the hospital, and police and fire departments. General instructions  Ask your doctor for a referral to a local prenatal class. Begin classes no later than at the start of month 6 of your pregnancy.  Ask for help if you need counseling or if you need help with nutrition. Your doctor can give you advice or tell you where to go for help.  Do not use hot tubs, steam rooms, or   saunas.  Do not douche or use tampons or scented sanitary pads.  Do not cross your legs for long periods of time.  Avoid all herbs and alcohol. Avoid drugs that are not approved by your doctor.  Do not use any tobacco products, including cigarettes, chewing tobacco, and electronic cigarettes. If you need help quitting, ask your doctor. You may get counseling or other support to help you quit.  Avoid cat litter boxes and soil used by cats. These carry germs that can cause birth defects in the baby and can cause a loss of your baby (miscarriage) or stillbirth.  Visit your dentist. At home, brush  your teeth with a soft toothbrush. Be gentle when you floss. Contact a doctor if:  You are dizzy.  You have mild cramps or pressure in your lower belly.  You have a nagging pain in your belly area.  You continue to feel sick to your stomach, you throw up, or you have watery poop (diarrhea).  You have a bad smelling fluid coming from your vagina.  You have pain when you pee (urinate).  You have increased puffiness (swelling) in your face, hands, legs, or ankles. Get help right away if:  You have a fever.  You are leaking fluid from your vagina.  You have spotting or bleeding from your vagina.  You have very bad belly cramping or pain.  You gain or lose weight rapidly.  You throw up blood. It may look like coffee grounds.  You are around people who have German measles, fifth disease, or chickenpox.  You have a very bad headache.  You have shortness of breath.  You have any kind of trauma, such as from a fall or a car accident. Summary  The first trimester of pregnancy is from week 1 until the end of week 13 (months 1 through 3).  To take care of yourself and your unborn baby, you will need to eat healthy meals, take medicines only if your doctor tells you to do so, and do activities that are safe for you and your baby.  Keep all follow-up visits as told by your doctor. This is important as your doctor will have to ensure that your baby is healthy and growing well. This information is not intended to replace advice given to you by your health care provider. Make sure you discuss any questions you have with your health care provider. Document Released: 02/04/2008 Document Revised: 08/26/2016 Document Reviewed: 08/26/2016 Elsevier Interactive Patient Education  2017 Elsevier Inc.  

## 2017-02-25 LAB — GC/CHLAMYDIA PROBE AMP (~~LOC~~) NOT AT ARMC
CHLAMYDIA, DNA PROBE: NEGATIVE
Neisseria Gonorrhea: NEGATIVE

## 2017-02-25 LAB — HIV ANTIBODY (ROUTINE TESTING W REFLEX): HIV SCREEN 4TH GENERATION: NONREACTIVE

## 2017-02-27 ENCOUNTER — Ambulatory Visit: Payer: Medicaid Other | Admitting: *Deleted

## 2017-02-27 ENCOUNTER — Encounter: Payer: Self-pay | Admitting: Family Medicine

## 2017-02-27 DIAGNOSIS — O3680X0 Pregnancy with inconclusive fetal viability, not applicable or unspecified: Secondary | ICD-10-CM

## 2017-02-27 LAB — HCG, QUANTITATIVE, PREGNANCY: hCG, Beta Chain, Quant, S: 778 m[IU]/mL — ABNORMAL HIGH (ref <5)

## 2017-02-27 NOTE — Progress Notes (Signed)
Mackenzie Lambert is in today for stat hcg level for pregnancy of unknown anatomic location. She reports light bleeding and some cramping that is unchanged since her emergency room visit. Patient is unable to wait for her results. Advised that I would call her as soon as they come back and I was able to review with a provider. Patient phone number to be reached at is [phone]. Discussed patient with Dr. Macon LargeAnyanwu, she recommends repeat hcg level in one week and an ultrasound.  Pt scheduled for ultrasound on July 6 at 2 pm. She will come to our office earlier that day for hcg level. She voiced understanding and had no further questions or concerns.

## 2017-03-06 ENCOUNTER — Ambulatory Visit (HOSPITAL_COMMUNITY)
Admission: RE | Admit: 2017-03-06 | Discharge: 2017-03-06 | Disposition: A | Discharge status: Home / Self Care | Payer: Medicaid Other | Source: Ambulatory Visit | Attending: Obstetrics & Gynecology | Admitting: Obstetrics & Gynecology

## 2017-03-06 DIAGNOSIS — O3680X0 Pregnancy with inconclusive fetal viability, not applicable or unspecified: Secondary | ICD-10-CM

## 2017-03-06 DIAGNOSIS — O209 Hemorrhage in early pregnancy, unspecified: Secondary | ICD-10-CM | POA: Insufficient documentation

## 2017-03-06 DIAGNOSIS — Z3A01 Less than 8 weeks gestation of pregnancy: Secondary | ICD-10-CM | POA: Diagnosis not present

## 2017-03-06 DIAGNOSIS — Z3491 Encounter for supervision of normal pregnancy, unspecified, first trimester: Secondary | ICD-10-CM | POA: Diagnosis not present

## 2017-03-06 DIAGNOSIS — Z3490 Encounter for supervision of normal pregnancy, unspecified, unspecified trimester: Secondary | ICD-10-CM | POA: Diagnosis present

## 2017-03-06 MED ORDER — PRENATAL PLUS 27-1 MG PO TABS: 1.0000 | ORAL_TABLET | Freq: Every day | ORAL | 0 refills | Status: DC

## 2017-03-06 NOTE — Discharge Instructions (Signed)
Vaginal Bleeding During Pregnancy, First Trimester A small amount of bleeding (spotting) from the vagina is relatively common in early pregnancy. It usually stops on its own. Various things may cause bleeding or spotting in early pregnancy. Some bleeding may be related to the pregnancy, and some may not. In most cases, the bleeding is normal and is not a problem. However, bleeding can also be a sign of something serious. Be sure to tell your health care provider about any vaginal bleeding right away. Some possible causes of vaginal bleeding during the first trimester include:  Infection or inflammation of the cervix.  Growths (polyps) on the cervix.  Miscarriage or threatened miscarriage.  Pregnancy tissue has developed outside of the uterus and in a fallopian tube (tubal pregnancy).  Tiny cysts have developed in the uterus instead of pregnancy tissue (molar pregnancy). Prenatal Care [facility]    [facility]  [phone]     Phone: [phone]          [facility]                      [facility] [place]  @[place]     Phone: [phone]  Phone: [phone]         [facility]     Phone: [phone]  Phone: [phone]           [facility] [place]                hone: [phone]  Phone: [phone]         [facility] [address] Francoise Ceo[facility]ard [facility]      Phone: [phone]  Phone: [phone]         [facility]                Phone: [phone]  [facility]   [phone]    [facility] [place])          Phone: [phone] [facility]   Phone: [phone] Follow these instructions at home: Watch your condition for any changes. The following actions may help to lessen any discomfort you are feeling:  Follow your health care provider's instructions for limiting  your activity. If your health care provider orders bed rest, you may need to stay in bed and only get up to use the bathroom. However, your health care provider may allow you to continue light activity.  If needed, make plans for someone to help with your regular activities and responsibilities while you are on bed rest.  Keep track of the number of pads you use each day, how often you change pads, and how soaked (saturated) they are. Write this down.    Do not use tampons. Do not douche.  Do not have sexual intercourse or orgasms until approved by your health care provider.  If you pass any tissue from your vagina, save the tissue so you can show it to your health care provider.  Only take over-the-counter or prescription medicines as directed by your health care provider.  Do not take aspirin because it can make you bleed.  Keep all follow-up appointments as directed by your health care provider.  Contact a health care provider if:  You have any vaginal bleeding during any part of your pregnancy.  You have cramps or labor pains.  You have  a fever, not controlled by medicine. Get help right away if:  You have severe cramps in your back or belly (abdomen).  You pass large clots or tissue from your vagina.  Your bleeding increases.  You feel light-headed or weak, or you have fainting episodes.  You have chills.  You are leaking fluid or have a gush of fluid from your vagina.  You pass out while having a bowel movement. This information is not intended to replace advice given to you by your health care provider. Make sure you discuss any questions you have with your health care provider. Document Released: 05/28/2005 Document Revised: 01/24/2016 Document Reviewed: 04/25/2013 Elsevier Interactive Patient Education  [employer].

## 2017-03-06 NOTE — Progress Notes (Addendum)
Patient ID: Vicie MuttersSharece D Platte, female   DOB: 1996/01/08, 21 y.o.   MRN: [id number] SUBJECTIVE:  Vicie MuttersSharece D Harbuck is a 21 y.o. G3P0020 at 7777w0d sent to MAU from ultrasound to review results. She's been followed for light bleeding and mild cramping and today had  follow-up ultrasound. She reports no further bleeding or cramping. Completed Flagyl for BV.  B pos.  OBJECTIVE:  Exam deferred.  Reviewed US result with patient    [place] Ob Transvaginal  Result Date: 03/06/2017 CLINICAL DATA:  Positive pregnancy test with indeterminate recent obstetrical ultrasound EXAM: TRANSVAGINAL OB ULTRASOUND TECHNIQUE: Transvaginal ultrasound was performed for complete evaluation of the gestation as well as the maternal uterus, adnexal regions, and pelvic cul-de-sac. COMPARISON:  February 24, 2017 FINDINGS: Intrauterine gestational sac: Visualized Yolk sac:  Visualized Embryo:  Visualized Cardiac Activity: Visualized Heart Rate: 92 bpm CRL:   2  mm   5 w 5 d                  US EDC: November 01, 2017 Subchorionic hemorrhage: There is a 1.0 x 0.7 cm subchorionic hemorrhage. Maternal uterus/adnexae: Cervical os is closed. There is a probable hemorrhagic corpus luteum on the right measuring 1.9 x 1.6 cm. No other extrauterine pelvic mass evident. There is trace free pelvic fluid. IMPRESSION: Single live intrauterine gestation with estimated gestational age of 486- weeks. Fairly small subchorionic hemorrhage. Probable hemorrhagic corpus luteum on the right. Electronically Signed   By: Bretta BangWilliam  Woodruff III M.D.   On: 03/06/2017 15:04     ASSESSMENT: G3P0020 at 6.0 wk with viable IUP, small [facility]  PLAN: Allergies as of 03/06/2017   No Known Allergies     Medication List    STOP taking these medications   metroNIDAZOLE 500 MG tablet Commonly known as:  FLAGYL     TAKE these medications   prenatal vitamin w/FE, FA 27-1 MG Tabs tablet Take 1 tablet by mouth daily.      Follow-up Information    [facility], [facility]. Schedule an appointment as soon as possible for a visit in 1 week(s).   Contact information: [address] [place] [address] [phone]         List of providers, Pregnancy verification letter and maternity eligibility requirement information given Bleeding precautions reviewed.  Danae Orleansoe, Parmvir Boomer C, CNM 03/06/2017 3:27 PM

## 2017-03-09 ENCOUNTER — Ambulatory Visit (HOSPITAL_COMMUNITY): Payer: Medicaid Other

## 2017-03-20 ENCOUNTER — Inpatient Hospital Stay (HOSPITAL_COMMUNITY)
Admission: AD | Admit: 2017-03-20 | Discharge: 2017-03-20 | Disposition: A | Discharge status: Home / Self Care | Payer: Medicaid Other | Source: Ambulatory Visit | Attending: Obstetrics & Gynecology | Admitting: Obstetrics & Gynecology

## 2017-03-20 ENCOUNTER — Encounter (HOSPITAL_COMMUNITY): Payer: Self-pay | Admitting: *Deleted

## 2017-03-20 DIAGNOSIS — O26851 Spotting complicating pregnancy, first trimester: Secondary | ICD-10-CM | POA: Insufficient documentation

## 2017-03-20 DIAGNOSIS — Z6790 Unspecified blood type, Rh positive: Secondary | ICD-10-CM

## 2017-03-20 DIAGNOSIS — Z3A08 8 weeks gestation of pregnancy: Secondary | ICD-10-CM | POA: Diagnosis not present

## 2017-03-20 LAB — URINALYSIS, ROUTINE W REFLEX MICROSCOPIC
Bilirubin Urine: NEGATIVE
Glucose, UA: NEGATIVE mg/dL
Hgb urine dipstick: NEGATIVE
Ketones, ur: NEGATIVE mg/dL
LEUKOCYTES UA: NEGATIVE
NITRITE: NEGATIVE
Protein, ur: NEGATIVE mg/dL
Specific Gravity, Urine: 1.015 (ref 1.005–1.030)
pH: 6 (ref 5.0–8.0)

## 2017-03-20 LAB — WET PREP, GENITAL
CLUE CELLS WET PREP: NONE SEEN
SPERM: NONE SEEN
TRICH WET PREP: NONE SEEN
YEAST WET PREP: NONE SEEN

## 2017-03-20 NOTE — Discharge Instructions (Signed)
Vaginal Bleeding During Pregnancy, First Trimester °A small amount of bleeding (spotting) from the vagina is common in early pregnancy. Sometimes the bleeding is normal and is not a problem, and sometimes it is a sign of something serious. Be sure to tell your doctor about any bleeding from your vagina right away. °Follow these instructions at home: °· Watch your condition for any changes. °· Follow your doctor's instructions about how active you can be. °· If you are on bed rest: °? You may need to stay in bed and only get up to use the bathroom. °? You may be allowed to do some activities. °? If you need help, make plans for someone to help you. °· Write down: °? The number of pads you use each day. °? How often you change pads. °? How soaked (saturated) your pads are. °· Do not use tampons. °· Do not douche. °· Do not have sex or orgasms until your doctor says it is okay. °· If you pass any tissue from your vagina, save the tissue so you can show it to your doctor. °· Only take medicines as told by your doctor. °· Do not take aspirin because it can make you bleed. °· Keep all follow-up visits as told by your doctor. °Contact a doctor if: °· You bleed from your vagina. °· You have cramps. °· You have labor pains. °· You have a fever that does not go away after you take medicine. °Get help right away if: °· You have very bad cramps in your back or belly (abdomen). °· You pass large clots or tissue from your vagina. °· You bleed more. °· You feel light-headed or weak. °· You pass out (faint). °· You have chills. °· You are leaking fluid or have a gush of fluid from your vagina. °· You pass out while pooping (having a bowel movement). °This information is not intended to replace advice given to you by your health care provider. Make sure you discuss any questions you have with your health care provider. °Document Released: 01/02/2014 Document Revised: 01/24/2016 Document Reviewed: 04/25/2013 °Elsevier Interactive  Patient Education © 2018 Elsevier Inc. ° °

## 2017-03-20 NOTE — MAU Provider Note (Signed)
History     CSN: [id number]  Arrival date and time: 03/20/17 1707  Chief Complaint  Patient presents with  . Vaginal Bleeding   G3P0020 @[redacted]w[redacted]d  by LMP here with spotting. She noticed pink spotting about 3 days ago then brown spotting since. No pain or cramping. IC about 1 week ago. She was seen a few weeks ago and found to have a viable IUP.    OB History    Gravida Para Term Preterm AB Living   3       2     SAB TAB Ectopic Multiple Live Births   2       0      Past Medical History:  Diagnosis Date  . Medical history non-contributory     Past Surgical History:  Procedure Laterality Date  . NO PAST SURGERIES      No family history on file.  Social History  Substance Use Topics  . Smoking status: Never Smoker  . Smokeless tobacco: Never Used  . Alcohol use No    Allergies: No Known Allergies  Prescriptions Prior to Admission  Medication Sig Dispense Refill Last Dose  . acetaminophen (TYLENOL) 500 MG tablet Take 500 mg by mouth every 6 (six) hours as needed for mild pain or moderate pain.   03/20/2017 at Unknown time  . prenatal vitamin w/FE, FA (PRENATAL 1 + 1) 27-1 MG TABS tablet Take 1 tablet by mouth daily. 30 each 0 03/20/2017 at Unknown time    Review of Systems  Gastrointestinal: Negative for abdominal pain.  Genitourinary: Positive for vaginal bleeding and vaginal discharge.   Physical Exam   Blood pressure 114/60, pulse 90, temperature 98.1 F (36.7 C), temperature source Oral, resp. rate 16, last menstrual period 01/23/2017.  Physical Exam  Nursing note and vitals reviewed. Constitutional: She is oriented to person, place, and time. She appears well-developed and well-nourished. No distress.  HENT:  Head: Normocephalic and atraumatic.  Neck: Normal range of motion.  Cardiovascular: Normal rate.   Respiratory: Effort normal. No respiratory distress.  GI: Soft. She exhibits no distension. There is no tenderness.  Genitourinary:  Genitourinary  Comments: External: no lesions or erythema Vagina: rugated, pink, moist, scant brown discharge, no active bleeding, cervix closed/long   Musculoskeletal: Normal range of motion.  Neurological: She is alert and oriented to person, place, and time.  Skin: Skin is warm and dry.  Psychiatric: She has a normal mood and affect.   Limited bedside US: viable, active fetus, +cardiac activity, subj. nml AFV  Results for orders placed or performed during the hospital encounter of 03/20/17 (from the past 24 hour(s))  Urinalysis, Routine w reflex microscopic     Status: None   Collection Time: 03/20/17  5:15 PM  Result Value Ref Range   Color, Urine YELLOW YELLOW   APPearance CLEAR CLEAR   Specific Gravity, Urine 1.015 1.005 - 1.030   pH 6.0 5.0 - 8.0   Glucose, UA NEGATIVE NEGATIVE mg/dL   Hgb urine dipstick NEGATIVE NEGATIVE   Bilirubin Urine NEGATIVE NEGATIVE   Ketones, ur NEGATIVE NEGATIVE mg/dL   Protein, ur NEGATIVE NEGATIVE mg/dL   Nitrite NEGATIVE NEGATIVE   Leukocytes, UA NEGATIVE NEGATIVE  Wet prep, genital     Status: Abnormal   Collection Time: 03/20/17  6:00 PM  Result Value Ref Range   Yeast Wet Prep HPF POC NONE SEEN NONE SEEN   Trich, Wet Prep NONE SEEN NONE SEEN   Clue Cells Wet Prep HPF POC  NONE SEEN NONE SEEN   WBC, Wet Prep HPF POC MODERATE (A) NONE SEEN   Sperm NONE SEEN    MAU Course  Procedures  MDM Labs ordered and reviewed. No evidence of impending SAB, spotting likely benign or caused by IC. Stable for discharge home.  Assessment and Plan   1. [redacted] weeks gestation of pregnancy   2. Spotting affecting pregnancy in first trimester   3. Blood type, Rh positive    Discharge [facility]  Allergies as of 03/20/2017   No Known Allergies     Medication List    TAKE these medications   acetaminophen 500 MG tablet Commonly known as:  TYLENOL Take 500 mg by mouth every 6 (six) hours as needed for mild  pain or moderate pain.   prenatal vitamin w/FE, FA 27-1 MG Tabs tablet Take 1 tablet by mouth daily.      Donette LarryMelanie Skyra Crichlow, CNM 03/20/2017, 6:08 PM

## 2017-04-06 LAB — OB RESULTS CONSOLE RPR: RPR: NONREACTIVE

## 2017-04-06 LAB — OB RESULTS CONSOLE ABO/RH: RH TYPE: POSITIVE

## 2017-04-06 LAB — OB RESULTS CONSOLE RUBELLA ANTIBODY, IGM: Rubella: IMMUNE

## 2017-04-06 LAB — OB RESULTS CONSOLE GC/CHLAMYDIA
CHLAMYDIA, DNA PROBE: NEGATIVE
GC PROBE AMP, GENITAL: NEGATIVE

## 2017-04-06 LAB — OB RESULTS CONSOLE HIV ANTIBODY (ROUTINE TESTING): HIV: NONREACTIVE

## 2017-04-06 LAB — OB RESULTS CONSOLE VARICELLA ZOSTER ANTIBODY, IGG: Varicella: NON-IMMUNE/NOT IMMUNE

## 2017-04-06 LAB — OB RESULTS CONSOLE HEPATITIS B SURFACE ANTIGEN: HEP B S AG: NEGATIVE

## 2017-04-06 LAB — OB RESULTS CONSOLE ANTIBODY SCREEN: Antibody Screen: NEGATIVE

## 2017-04-07 ENCOUNTER — Other Ambulatory Visit (HOSPITAL_COMMUNITY): Payer: Self-pay | Admitting: Nurse Practitioner

## 2017-04-07 DIAGNOSIS — Z3682 Encounter for antenatal screening for nuchal translucency: Secondary | ICD-10-CM

## 2017-04-07 DIAGNOSIS — Z3A12 12 weeks gestation of pregnancy: Secondary | ICD-10-CM

## 2017-04-13 ENCOUNTER — Other Ambulatory Visit: Payer: Self-pay | Admitting: Obstetrics and Gynecology

## 2017-04-21 ENCOUNTER — Telehealth: Payer: Self-pay | Admitting: *Deleted

## 2017-04-21 ENCOUNTER — Telehealth: Payer: Self-pay

## 2017-04-21 NOTE — Telephone Encounter (Signed)
Patient called the office stating that she needs a letter stating that she can't stand for long periods of time. Patient stated that she has a letter but her supervisor needs more verification. Called patient but phone signal dropped.

## 2017-04-21 NOTE — Telephone Encounter (Signed)
Pt left message yesterday stating that she was given a work restrictions letter from this office for her job. It states "No prolonged standing" and her employer asked her to call us to get clarification. Pt states that she works in a [employer] and her shifts are 8-12 hours at a time.

## 2017-04-22 ENCOUNTER — Encounter: Payer: Self-pay | Admitting: Family Medicine

## 2017-04-22 ENCOUNTER — Ambulatory Visit (HOSPITAL_COMMUNITY)
Admission: RE | Admit: 2017-04-22 | Discharge: 2017-04-22 | Disposition: A | Discharge status: Home / Self Care | Payer: Medicaid Other | Source: Ambulatory Visit | Attending: Nurse Practitioner | Admitting: Nurse Practitioner

## 2017-04-22 ENCOUNTER — Ambulatory Visit (INDEPENDENT_AMBULATORY_CARE_PROVIDER_SITE_OTHER): Payer: Medicaid Other | Admitting: Family Medicine

## 2017-04-22 ENCOUNTER — Encounter (HOSPITAL_COMMUNITY): Payer: Self-pay

## 2017-04-22 ENCOUNTER — Encounter: Payer: Self-pay | Admitting: General Practice

## 2017-04-22 DIAGNOSIS — Z3682 Encounter for antenatal screening for nuchal translucency: Secondary | ICD-10-CM | POA: Diagnosis not present

## 2017-04-22 DIAGNOSIS — O0992 Supervision of high risk pregnancy, unspecified, second trimester: Secondary | ICD-10-CM

## 2017-04-22 DIAGNOSIS — O09211 Supervision of pregnancy with history of pre-term labor, first trimester: Secondary | ICD-10-CM | POA: Insufficient documentation

## 2017-04-22 DIAGNOSIS — Z3A12 12 weeks gestation of pregnancy: Secondary | ICD-10-CM | POA: Insufficient documentation

## 2017-04-22 DIAGNOSIS — O09292 Supervision of pregnancy with other poor reproductive or obstetric history, second trimester: Secondary | ICD-10-CM

## 2017-04-22 DIAGNOSIS — O0990 Supervision of high risk pregnancy, unspecified, unspecified trimester: Secondary | ICD-10-CM | POA: Insufficient documentation

## 2017-04-22 NOTE — Progress Notes (Signed)
Pt reports increased heartburn.

## 2017-04-22 NOTE — Patient Instructions (Signed)

## 2017-04-22 NOTE — Progress Notes (Signed)
   PRENATAL VISIT NOTE  Subjective:  Mackenzie Lambert is a 21 y.o. G3P0110 at [redacted]w[redacted]d being seen today for transferring prenatal care from [facility].  She is currently monitored for the following issues for this high-risk pregnancy and has Supervision of high risk pregnancy, antepartum and Prior perinatal loss in second trimester, antepartum on her problem list.  Patient reports fatigue and nausea.   . Vag. Bleeding: None.  Movement: Absent. Denies leaking of fluid.   The following portions of the patient's history were reviewed and updated as appropriate: allergies, current medications, past family history, past medical history, past social history, past surgical history and problem list. Problem list updated.  Objective:   Vitals:   04/22/17 1459  BP: 109/72  Pulse: 84  Weight: 117 lb 4.8 oz (53.2 kg)    Fetal Status: Fetal Heart Rate (bpm): 170   Movement: Absent     General:  Alert, oriented and cooperative. Patient is in no acute distress.  Skin: Skin is warm and dry. No rash noted.   Cardiovascular: Normal heart rate noted  Respiratory: Normal respiratory effort, no problems with respiration noted  Abdomen: Soft, gravid, appropriate for gestational age.  Pain/Pressure: Present     Pelvic: Cervical exam deferred        Extremities: Normal range of motion.  Edema: None  Mental Status:  Normal mood and affect. Normal behavior. Normal judgment and thought content.   Assessment and Plan:  Pregnancy: G3P0110 at [redacted]w[redacted]d  1. Supervision of high risk pregnancy, antepartum For Nuchal translucency today  2. Prior perinatal loss in second trimester, antepartum Had sonogram showing 13 wk fetus 9/16--no other visits, reports fever, chills, bleeding and loss of baby while on toilet 2 months later (remembers 1 month prior to graduation 12/16).States she did not seek care. Knows it was a female fetus. Unclear etiology--PPROM, incompetent cervix, PTL, infection? Will check serial cervical lengths,  add prometrium and possible cerclage if needed. - [place] MFM OB Transvaginal; Future  General obstetric precautions including but not limited to vaginal bleeding, contractions, leaking of fluid and fetal movement were reviewed in detail with the patient. Please refer to After Visit Summary for other counseling recommendations.  Return in 4 weeks (on 05/20/2017).   Reva Bores, MD

## 2017-04-22 NOTE — Telephone Encounter (Signed)
Called patient and discussed that we cannot provided a letter more descriptive than what we have given her but she might want to ask her employer if they have forms they need [place] to fill out. Noticed patient has appt today at 3pm and asked if she was aware of that appt. Patient states she was told not to come to that and just to the MFM appt. Told patient she still needs to come to her appt with [place] at 3pm before mfm. Told patient she can talk to Dr Shawnie Pons at that time about her job concerns. Patient verbalized understanding & had no questions

## 2017-05-05 ENCOUNTER — Telehealth: Payer: Self-pay

## 2017-05-05 NOTE — Telephone Encounter (Signed)
Pt called and left message about FMLA. I do not see any FMLA paperwork for her. Called pt back and left message for her to return call.

## 2017-05-12 ENCOUNTER — Other Ambulatory Visit: Payer: Self-pay

## 2017-05-13 ENCOUNTER — Encounter: Payer: Self-pay | Admitting: Advanced Practice Midwife

## 2017-05-13 ENCOUNTER — Ambulatory Visit (INDEPENDENT_AMBULATORY_CARE_PROVIDER_SITE_OTHER): Payer: Medicaid Other | Admitting: Advanced Practice Midwife

## 2017-05-13 VITALS — BP 118/77 | HR 72 | Wt 120.0 lb

## 2017-05-13 DIAGNOSIS — O0990 Supervision of high risk pregnancy, unspecified, unspecified trimester: Secondary | ICD-10-CM

## 2017-05-13 DIAGNOSIS — Z363 Encounter for antenatal screening for malformations: Secondary | ICD-10-CM

## 2017-05-13 DIAGNOSIS — O0992 Supervision of high risk pregnancy, unspecified, second trimester: Secondary | ICD-10-CM

## 2017-05-13 DIAGNOSIS — Z3A16 16 weeks gestation of pregnancy: Secondary | ICD-10-CM

## 2017-05-13 DIAGNOSIS — O09292 Supervision of pregnancy with other poor reproductive or obstetric history, second trimester: Secondary | ICD-10-CM

## 2017-05-13 NOTE — Patient Instructions (Signed)

## 2017-05-15 NOTE — Progress Notes (Signed)
   PRENATAL VISIT NOTE  Subjective:  Mackenzie Lambert is a 21 y.o. G3P0110 at [redacted]w[redacted]d being seen today for ongoing prenatal care.  She is currently monitored for the following issues for this high-risk pregnancy and has Supervision of high risk pregnancy, antepartum and Prior perinatal loss in second trimester, antepartum on her problem list.  Patient reports no complaints.   .  .  Movement: Present. Denies leaking of fluid.   The following portions of the patient's history were reviewed and updated as appropriate: allergies, current medications, past family history, past medical history, past social history, past surgical history and problem list. Problem list updated.  Objective:   Vitals:   05/13/17 1111  BP: 118/77  Pulse: 72  Weight: 120 lb (54.4 kg)    Fetal Status: Fetal Heart Rate (bpm): 152   Movement: Present     General:  Alert, oriented and cooperative. Patient is in no acute distress.  Skin: Skin is warm and dry. No rash noted.   Cardiovascular: Normal heart rate noted  Respiratory: Normal respiratory effort, no problems with respiration noted  Abdomen: Soft, gravid, appropriate for gestational age.  Pain/Pressure: Present     Pelvic: Cervical exam deferred        Extremities: Normal range of motion.  Edema: None  Mental Status:  Normal mood and affect. Normal behavior. Normal judgment and thought content.   Assessment and Plan:  Pregnancy: G3P0110 at [redacted]w[redacted]d  1. Supervision of high risk pregnancy, antepartum  - [place] MFM OB LIMITED; Future - [place] MFM OB DETAIL +14 WK; Future - AMB referral to maternal fetal medicine  2. [redacted] weeks gestation of pregnancy  - [place] MFM OB LIMITED; Future - [place] MFM OB DETAIL +14 WK; Future - AMB referral to maternal fetal medicine  3. Screening, antenatal, for malformation by ultrasound  - [place] MFM OB LIMITED; Future - [place] MFM OB DETAIL +14 WK; Future - AMB referral to maternal fetal medicine  4. Hx second trimester loss of unknown  etiology - [place] for CL,  - MFM consult to discuss if pt is candidate for 17-P or vaginal progesterone.   Requesting 10 lb lifting limit for work--given.  Preterm labor symptoms and general obstetric precautions including but not limited to vaginal bleeding, contractions, leaking of fluid and fetal movement were reviewed in detail with the patient. Please refer to After Visit Summary for other counseling recommendations.  Return in about 4 weeks (around 06/10/2017) for ROB.   Dorathy Kinsman, CNM

## 2017-05-18 ENCOUNTER — Other Ambulatory Visit: Payer: Self-pay | Admitting: General Practice

## 2017-05-18 DIAGNOSIS — O09292 Supervision of pregnancy with other poor reproductive or obstetric history, second trimester: Secondary | ICD-10-CM

## 2017-05-18 NOTE — Progress Notes (Signed)
05/18/17 5:26pm Addendum: FMLA completed

## 2017-05-22 ENCOUNTER — Other Ambulatory Visit: Payer: Self-pay | Admitting: Family Medicine

## 2017-05-22 ENCOUNTER — Ambulatory Visit (INDEPENDENT_AMBULATORY_CARE_PROVIDER_SITE_OTHER): Payer: Medicaid Other | Admitting: *Deleted

## 2017-05-22 ENCOUNTER — Ambulatory Visit (HOSPITAL_COMMUNITY): Payer: Medicaid Other

## 2017-05-22 ENCOUNTER — Other Ambulatory Visit (HOSPITAL_COMMUNITY): Payer: Self-pay | Admitting: *Deleted

## 2017-05-22 ENCOUNTER — Other Ambulatory Visit (HOSPITAL_COMMUNITY)
Admission: RE | Admit: 2017-05-22 | Discharge: 2017-05-22 | Disposition: A | Discharge status: Home / Self Care | Payer: Medicaid Other | Source: Ambulatory Visit | Attending: Family Medicine | Admitting: Family Medicine

## 2017-05-22 ENCOUNTER — Ambulatory Visit (HOSPITAL_COMMUNITY)
Admission: RE | Admit: 2017-05-22 | Discharge: 2017-05-22 | Disposition: A | Discharge status: Home / Self Care | Payer: Medicaid Other | Source: Ambulatory Visit | Attending: Family Medicine | Admitting: Family Medicine

## 2017-05-22 ENCOUNTER — Ambulatory Visit (HOSPITAL_COMMUNITY)
Admission: RE | Admit: 2017-05-22 | Discharge: 2017-05-22 | Disposition: A | Discharge status: Home / Self Care | Payer: Medicaid Other | Source: Ambulatory Visit | Attending: Advanced Practice Midwife | Admitting: Advanced Practice Midwife

## 2017-05-22 ENCOUNTER — Encounter (HOSPITAL_COMMUNITY): Payer: Self-pay

## 2017-05-22 DIAGNOSIS — N898 Other specified noninflammatory disorders of vagina: Secondary | ICD-10-CM

## 2017-05-22 DIAGNOSIS — B373 Candidiasis of vulva and vagina: Secondary | ICD-10-CM | POA: Diagnosis not present

## 2017-05-22 DIAGNOSIS — Z3A16 16 weeks gestation of pregnancy: Secondary | ICD-10-CM

## 2017-05-22 DIAGNOSIS — O09292 Supervision of pregnancy with other poor reproductive or obstetric history, second trimester: Secondary | ICD-10-CM

## 2017-05-22 DIAGNOSIS — Z113 Encounter for screening for infections with a predominantly sexual mode of transmission: Secondary | ICD-10-CM | POA: Diagnosis not present

## 2017-05-22 DIAGNOSIS — Z3686 Encounter for antenatal screening for cervical length: Secondary | ICD-10-CM

## 2017-05-22 NOTE — Progress Notes (Signed)
MATERNAL FETAL MEDICINE CONSULT  Patient Name: Mackenzie Lambert Medical Record Number:  [id number] Date of Birth: May 06, 1996 Requesting Physician Name:  Mackenzie Lambert, CNM Date of Service: 05/22/2017  Chief Complaint Prior preterm delivery at approximately 21 weeks  History of Present Illness Mackenzie Lambert is a 21 y.o. G3P0110 at [redacted]w[redacted]d with an EDD of 11/01/2017, by Ultrasound who was seen today secondary to a prior preterm delivery at 21 weeks at the request of Mackenzie Lambert, [place].  Her first pregnancy ended in a spontaneous loss at approximately 6 weeks.  During her second pregnancy [name] received very little prenatal care and began to experience fevers and chills at approximately 21 weeks.  The following day she developed increasing abdominal pain and bleeding and delivered her fetus at home.  No workup on [name] or the fetus was performed.  Today she reports increased vaginal discharge and vaginal burning for the past several days.  She denies vaginal bleeding, loss of fluid, or contractions.    Review of Systems Pertinent items are noted in HPI.  Patient History OB History  Gravida Para Term Preterm AB Living  SAB TAB Ectopic Multiple Live Births  1       0    # Outcome Date GA Lbr Len/2nd Weight Sex Delivery Anes PTL Lv  3 Current           2 Preterm 07/17/15 [redacted]w[redacted]d   F Vag-Spont     1 SAB 01/29/15 [redacted]w[redacted]d             Past Medical History:  Diagnosis Date  . Medical history non-contributory   . PONV (postoperative nausea and vomiting)     Past Surgical History:  Procedure Laterality Date  . NO PAST SURGERIES      Social History   Social History  . Marital status: Single    Spouse name: N/A  . Number of children: N/A  . Years of education: N/A   Social History Main Topics  . Smoking status: Never Smoker  . Smokeless tobacco: Never Used  . Alcohol use No  . Drug use: No  . Sexual activity: Yes    Birth control/ protection: None    Other Topics Concern  . Not on file   Social History Narrative  . No narrative on file    Family History  Problem Relation Age of Onset  . Hypertension Maternal Grandmother   . Hypertension Maternal Grandfather    In addition, the patient has no family history of mental retardation, birth defects, or genetic diseases.  Physical Examination Vitals - Pulse 84, BP 111/62, Weight 122 lbs General appearance - alert, well appearing, and in no distress Mental status - alert, oriented to person, place, and time  Assessment and Recommendations 1.  Prior preterm delivery.  From Mackenzie Lambert's description it appears as though she developed chorioamnionitis which lead to the preterm birth.  It is not possible to determine if the infection proceeded cervical dilation, such as would be the case in spontaneous preterm labor, or the cervix first dilated allowing vaginal flora to invade the vagina and trigger labor, as would be the case for cervical insufficiency.  This is an academic point as Mackenzie Lambert should be treated the same regardless of which of the above scenarios occurred in her last pregnancy.  She  should receive weekly 17-OH progesterone injections beginning at 16 weeks and continuing through 36 weeks.  In addition,  she should have serial transvaginal cervical length measurements also starting at 16 weeks and continuing until approximately 26 weeks.  Mackenzie Lambert had a persistent contraction in the lower uterine segment that prevented accurate measurement of the cervical length today.  She will return early next week for another attempt at cervical length measurement.  The cervical length measurements can initially be done every 2 weeks, but should be done weekly if evidence of cervical shortening is found.  If her cervical length should fall below 2.5 cm prior to 23 weeks of gestation cervical cerclage placement should be considered.  Should cervical shortening be found after viability,  hospitalization could be considered especially if very remote from term and the patient lives far from a tertiary care center to ensure delivery of a severely preterm infant occurs at an appropriately equipped facility.  However, strict bedrest has not been proven to reduce Lambert of preterm delivery or prolong gestations and thus should be avoided.  However, activity restriction would be reasonable again especially if cervical shortening is seen very remote from term. 2.  Vaginal discharge and burning.  Mackenzie Lambert is concerned she has trichomonas, as the symptoms she is experiencing is similar to that she experienced during a prior trichomonas infection.  It is important that his be properly evaluated and treated especially in Mackenzie Lambert's case as such and infection is associated with an increase Lambert of preterm delivery.  I have contracted the high Lambert clinic here at [facility] and Mackenzie Lambert will go straight there for evaluation and potential treatment of this issue.   I spent 30 minutes with Mackenzie Lambert today of which 50% was face-to-face counseling.  Thank you for referring Mackenzie Lambert to the [facility].  Please do not hesitate to contact us with questions.   Rema Fendt, MD

## 2017-05-22 NOTE — Progress Notes (Signed)
Pt reports vaginal discharge.

## 2017-05-25 ENCOUNTER — Ambulatory Visit (HOSPITAL_COMMUNITY)
Admission: RE | Admit: 2017-05-25 | Discharge: 2017-05-25 | Disposition: A | Discharge status: Home / Self Care | Payer: Medicaid Other | Source: Ambulatory Visit | Attending: Maternal and Fetal Medicine | Admitting: Maternal and Fetal Medicine

## 2017-05-25 ENCOUNTER — Ambulatory Visit (HOSPITAL_COMMUNITY): Payer: Medicaid Other

## 2017-05-25 ENCOUNTER — Encounter (HOSPITAL_COMMUNITY): Payer: Self-pay

## 2017-05-25 DIAGNOSIS — O281 Abnormal biochemical finding on antenatal screening of mother: Secondary | ICD-10-CM | POA: Diagnosis not present

## 2017-05-25 DIAGNOSIS — O09292 Supervision of pregnancy with other poor reproductive or obstetric history, second trimester: Secondary | ICD-10-CM | POA: Diagnosis present

## 2017-05-25 DIAGNOSIS — Z3A17 17 weeks gestation of pregnancy: Secondary | ICD-10-CM | POA: Insufficient documentation

## 2017-05-25 DIAGNOSIS — Z3686 Encounter for antenatal screening for cervical length: Secondary | ICD-10-CM | POA: Insufficient documentation

## 2017-05-25 LAB — CERVICOVAGINAL ANCILLARY ONLY
BACTERIAL VAGINITIS: NEGATIVE
CANDIDA VAGINITIS: POSITIVE — AB
Chlamydia: NEGATIVE
Neisseria Gonorrhea: NEGATIVE
Trichomonas: NEGATIVE

## 2017-05-27 ENCOUNTER — Telehealth: Payer: Self-pay | Admitting: *Deleted

## 2017-05-27 DIAGNOSIS — B3731 Acute candidiasis of vulva and vagina: Secondary | ICD-10-CM

## 2017-05-27 DIAGNOSIS — B373 Candidiasis of vulva and vagina: Secondary | ICD-10-CM

## 2017-05-27 MED ORDER — MICONAZOLE NITRATE 2 % VA CREA: 1.0000 | TOPICAL_CREAM | Freq: Every day | VAGINAL | 0 refills | Status: AC

## 2017-05-27 NOTE — Telephone Encounter (Signed)
I attempted to call this patient to inform her of test results and new rx. Call went straight to voice mail. Left message stating I have non urgent test results and a new rx has been sent to her pharmacy. Asked that she return my call at the clinic.

## 2017-05-27 NOTE — Telephone Encounter (Signed)
-----   Message from Mackenzie Heritage, DO sent at 05/26/2017  3:50 PM EDT ----- Please prescribe monistat for this patient and inform her of her results.

## 2017-06-02 ENCOUNTER — Encounter: Payer: Self-pay | Admitting: *Deleted

## 2017-06-02 NOTE — Telephone Encounter (Signed)
Attempted to call patient again. The phone rang repeatedly and then stopped. Will send a letter.

## 2017-06-03 ENCOUNTER — Encounter (HOSPITAL_COMMUNITY): Payer: Self-pay | Admitting: Advanced Practice Midwife

## 2017-06-10 ENCOUNTER — Encounter: Payer: Medicaid Other | Admitting: Advanced Practice Midwife

## 2017-06-10 ENCOUNTER — Ambulatory Visit (HOSPITAL_COMMUNITY): Admission: RE | Admit: 2017-06-10 | Payer: Medicaid Other | Source: Ambulatory Visit

## 2017-06-12 ENCOUNTER — Other Ambulatory Visit (HOSPITAL_COMMUNITY): Payer: Self-pay | Admitting: *Deleted

## 2017-06-12 ENCOUNTER — Ambulatory Visit (HOSPITAL_COMMUNITY)
Admission: RE | Admit: 2017-06-12 | Discharge: 2017-06-12 | Disposition: A | Discharge status: Home / Self Care | Payer: Medicaid Other | Source: Ambulatory Visit | Attending: Advanced Practice Midwife | Admitting: Advanced Practice Midwife

## 2017-06-12 ENCOUNTER — Other Ambulatory Visit: Payer: Self-pay | Admitting: Advanced Practice Midwife

## 2017-06-12 ENCOUNTER — Encounter (HOSPITAL_COMMUNITY): Payer: Self-pay

## 2017-06-12 DIAGNOSIS — O09212 Supervision of pregnancy with history of pre-term labor, second trimester: Secondary | ICD-10-CM

## 2017-06-12 DIAGNOSIS — Z3686 Encounter for antenatal screening for cervical length: Secondary | ICD-10-CM | POA: Insufficient documentation

## 2017-06-12 DIAGNOSIS — Z363 Encounter for antenatal screening for malformations: Secondary | ICD-10-CM | POA: Insufficient documentation

## 2017-06-12 DIAGNOSIS — O0990 Supervision of high risk pregnancy, unspecified, unspecified trimester: Secondary | ICD-10-CM

## 2017-06-12 DIAGNOSIS — O281 Abnormal biochemical finding on antenatal screening of mother: Secondary | ICD-10-CM

## 2017-06-12 DIAGNOSIS — Z3A19 19 weeks gestation of pregnancy: Secondary | ICD-10-CM | POA: Diagnosis not present

## 2017-06-12 DIAGNOSIS — O09219 Supervision of pregnancy with history of pre-term labor, unspecified trimester: Secondary | ICD-10-CM

## 2017-06-12 DIAGNOSIS — Z3A16 16 weeks gestation of pregnancy: Secondary | ICD-10-CM

## 2017-06-24 ENCOUNTER — Ambulatory Visit (INDEPENDENT_AMBULATORY_CARE_PROVIDER_SITE_OTHER): Payer: Medicaid Other | Admitting: Obstetrics and Gynecology

## 2017-06-24 VITALS — BP 114/60 | HR 79 | Wt 121.0 lb

## 2017-06-24 DIAGNOSIS — O0990 Supervision of high risk pregnancy, unspecified, unspecified trimester: Secondary | ICD-10-CM

## 2017-06-24 DIAGNOSIS — O09292 Supervision of pregnancy with other poor reproductive or obstetric history, second trimester: Secondary | ICD-10-CM

## 2017-06-24 DIAGNOSIS — O0992 Supervision of high risk pregnancy, unspecified, second trimester: Secondary | ICD-10-CM

## 2017-06-24 DIAGNOSIS — O09212 Supervision of pregnancy with history of pre-term labor, second trimester: Secondary | ICD-10-CM

## 2017-06-24 DIAGNOSIS — Z8751 Personal history of pre-term labor: Secondary | ICD-10-CM

## 2017-06-24 MED ORDER — PROGESTERONE MICRONIZED 200 MG PO CAPS: ORAL_CAPSULE | ORAL | 0 refills | Status: DC

## 2017-06-24 NOTE — Progress Notes (Signed)
Pt c/o some cramping off and on. Also having trouble with constipation/hemorrhoids. 17 p today.

## 2017-06-24 NOTE — Progress Notes (Signed)
PRENATAL VISIT NOTE  Subjective:  Mackenzie Lambert is a 21 y.o. G3P0110 at [redacted]w[redacted]d being seen today for ongoing prenatal care.  She is currently monitored for the following issues for this high-risk pregnancy and has Supervision of high risk pregnancy, antepartum and Prior perinatal loss in second trimester, antepartum on her problem list.  Patient reports occasional right sided pain with movement, improves with rest, mild in nature.  Contractions: Not present. Vag. Bleeding: None.  Movement: Present. Denies leaking of fluid.   The following portions of the patient's history were reviewed and updated as appropriate: allergies, current medications, past family history, past medical history, past social history, past surgical history and problem list. Problem list updated.  Objective:   Vitals:   06/24/17 1512  BP: 114/60  Pulse: 79  Weight: 121 lb (54.9 kg)    Fetal Status: Fetal Heart Rate (bpm): 161   Movement: Present     General:  Alert, oriented and cooperative. Patient is in no acute distress.  Skin: Skin is warm and dry. No rash noted.   Cardiovascular: Normal heart rate noted  Respiratory: Normal respiratory effort, no problems with respiration noted  Abdomen: Soft, gravid, appropriate for gestational age.  Pain/Pressure: Present     Pelvic: Cervical exam deferred        Extremities: Normal range of motion.  Edema: None  Mental Status:  Normal mood and affect. Normal behavior. Normal judgment and thought content.   [place] Mfm Ob Transvaginal  Result Date: 06/12/2017 ----------------------------------------------------------------------  OBSTETRICS REPORT                      (Signed Final 06/12/2017 05:56 pm) ---------------------------------------------------------------------- Patient Info  ID #:       [id number]                          D.O.B.:  28-Nov-1995 (21 yrs)  Name:       Mackenzie Lambert              Visit Date: 06/12/2017 10:05 am  ---------------------------------------------------------------------- Performed By  Performed By:     Hurman Horn          Ref. Address:     [address]                    RDMS                                                             Rd                                                             Jacky Kindle                                                             [phone]  Attending:  Particia Nearing MD       Location:         [facility]  Referred By:      Levie Heritage                    MD ---------------------------------------------------------------------- Orders   #  Description                                 Code   1  [place] MFM OB DETAIL +14 WK                     76811.01   2  [place] MFM OB TRANSVAGINAL                      16109.6  ----------------------------------------------------------------------   #  Ordered By               Order #        Accession #    Episode #   1  Dorathy Kinsman           [id number]      [id number]     [id number]   2  VIRGINIA SMITH           [id number]      [id number]     [id number]  ---------------------------------------------------------------------- Indications   [redacted] weeks gestation of pregnancy                Z3A.19   Poor obstetric history: Previous midtrimester  O09.299   loss (21 weeks)   Encounter for cervical length                  Z36.86   Abnormal biochemical finding on antenatal      O28.1   screening of mother (low PAPP-A)   Encounter for fetal anatomic survey            Z36.89  ---------------------------------------------------------------------- OB History  Blood Type:            Height:  5'1"   Weight (lb):  117       BMI:  22.1  Gravidity:    3         Term:   0        Prem:   1        SAB:   1  TOP:          0       Ectopic:  0        Living: 0 ---------------------------------------------------------------------- Fetal Evaluation  Num Of Fetuses:     1  Fetal Heart         159  Rate(bpm):  Cardiac Activity:   Observed  Presentation:        Breech  Placenta:           Anterior, above cervical os  P. Cord Insertion:  Visualized, central  Amniotic Fluid  AFI FV:      Subjectively within normal limits                              Largest Pocket(cm)                              4.26 ---------------------------------------------------------------------- Biometry  BPD:  48.6  mm     G. Age:  20w 5d         86  %    CI:        76.38   %    70 - 86                                                          FL/HC:      18.1   %    16.8 - 19.8  HC:      176.2  mm     G. Age:  20w 1d         61  %    HC/AC:      1.21        1.09 - 1.39  AC:      145.9  mm     G. Age:  19w 6d         51  %    FL/BPD:     65.6   %  FL:       31.9  mm     G. Age:  20w 0d         50  %    FL/AC:      21.9   %    20 - 24  CER:      21.4  mm     G. Age:  20w 3d         61  %  NFT:       5.7  mm  CM:        4.5  mm  Est. FW:     323  gm    0 lb 11 oz      52  % ---------------------------------------------------------------------- Gestational Age  LMP:           20w 0d        Date:  01/23/17                 EDD:   10/30/17  U/S Today:     20w 1d                                        EDD:   10/29/17  Best:          19w 5d     Det. By:  Previous Ultrasound      EDD:   11/01/17                                      (03/06/17) ---------------------------------------------------------------------- Anatomy  Cranium:               Appears normal         Aortic Arch:            Appears normal  Cavum:                 Appears normal         Ductal Arch:            Appears normal  Ventricles:  Appears normal         Diaphragm:              Appears normal  Choroid Plexus:        Appears normal         Stomach:                Appears normal, left                                                                        sided  Cerebellum:            Appears normal         Abdomen:                Appears normal  Posterior Fossa:       Appears normal         Abdominal Wall:         Appears nml  (cord                                                                        insert, abd wall)  Nuchal Fold:           Appears normal         Cord Vessels:           Appears normal (3                                                                        vessel cord)  Face:                  Appears normal         Kidneys:                Appear normal                         (orbits and profile)  Lips:                  Appears normal         Bladder:                Appears normal  Thoracic:              Appears normal         Spine:                  Appears normal  Heart:                 Appears normal         Upper Extremities:      Appears normal                         (  4CH, axis, and                         situs)  RVOT:                  Appears normal         Lower Extremities:      Appears normal  LVOT:                  Appears normal  Other:  Fetus appears to be a female. Heels and LT 5th digit visualized.          Technically difficult due to fetal position. ---------------------------------------------------------------------- Cervix Uterus Adnexa  Cervix  Measured transvaginally.  Uterus  No abnormality visualized.  Left Ovary  Within normal limits.  Right Ovary  Within normal limits.  Adnexa:       No abnormality visualized. ---------------------------------------------------------------------- Impression  SIUP at 19+5 weeks  Normal detailed fetal anatomy  Markers of aneuploidy: none  Normal amniotic fluid volume  Measurements consistent with prior US  EV views of cervix: normal length without funneling (mild  contraction in LUS) ---------------------------------------------------------------------- Recommendations  Follow-up ultrasound for cervical length in 2 and 4  weeks  Growth [place] in 4 weeks ----------------------------------------------------------------------                 Particia Nearing, MD Electronically Signed Final Report   06/12/2017 05:56 pm  ----------------------------------------------------------------------  [place] Mfm Ob Detail +14 Wk  Result Date: 06/12/2017 ----------------------------------------------------------------------  OBSTETRICS REPORT                      (Signed Final 06/12/2017 05:56 pm) ---------------------------------------------------------------------- Patient Info  ID #:       [id number]                          D.O.B.:  10-May-1996 (21 yrs)  Name:       KYLEIGHA MARKERT              Visit Date: 06/12/2017 10:05 am ---------------------------------------------------------------------- Performed By  Performed By:     Hurman Horn          Ref. Address:     [address]                    RDMS                                                             Rd                                                             Jacky Kindle                                                             [phone]  Attending:  Particia Nearing MD       Location:         [facility]  Referred By:      Levie Heritage                    MD ---------------------------------------------------------------------- Orders   #  Description                                 Code   1  [place] MFM OB DETAIL +14 WK                     76811.01   2  [place] MFM OB TRANSVAGINAL                      16109.6  ----------------------------------------------------------------------   #  Ordered By               Order #        Accession #    Episode #   1  Dorathy Kinsman           [id number]      [id number]     [id number]   2  VIRGINIA SMITH           [id number]      [id number]     [id number]  ---------------------------------------------------------------------- Indications   [redacted] weeks gestation of pregnancy                Z3A.19   Poor obstetric history: Previous midtrimester  O09.299   loss (21 weeks)   Encounter for cervical length                  Z36.86   Abnormal biochemical finding on antenatal      O28.1   screening of mother (low PAPP-A)   Encounter for fetal  anatomic survey            Z36.89  ---------------------------------------------------------------------- OB History  Blood Type:            Height:  5'1"   Weight (lb):  117       BMI:  22.1  Gravidity:    3         Term:   0        Prem:   1        SAB:   1  TOP:          0       Ectopic:  0        Living: 0 ---------------------------------------------------------------------- Fetal Evaluation  Num Of Fetuses:     1  Fetal Heart         159  Rate(bpm):  Cardiac Activity:   Observed  Presentation:       Breech  Placenta:           Anterior, above cervical os  P. Cord Insertion:  Visualized, central  Amniotic Fluid  AFI FV:      Subjectively within normal limits                              Largest Pocket(cm)                              4.26 ---------------------------------------------------------------------- Biometry  BPD:  48.6  mm     G. Age:  20w 5d         86  %    CI:        76.38   %    70 - 86                                                          FL/HC:      18.1   %    16.8 - 19.8  HC:      176.2  mm     G. Age:  20w 1d         61  %    HC/AC:      1.21        1.09 - 1.39  AC:      145.9  mm     G. Age:  19w 6d         51  %    FL/BPD:     65.6   %  FL:       31.9  mm     G. Age:  20w 0d         50  %    FL/AC:      21.9   %    20 - 24  CER:      21.4  mm     G. Age:  20w 3d         61  %  NFT:       5.7  mm  CM:        4.5  mm  Est. FW:     323  gm    0 lb 11 oz      52  % ---------------------------------------------------------------------- Gestational Age  LMP:           20w 0d        Date:  01/23/17                 EDD:   10/30/17  U/S Today:     20w 1d                                        EDD:   10/29/17  Best:          19w 5d     Det. By:  Previous Ultrasound      EDD:   11/01/17                                      (03/06/17) ---------------------------------------------------------------------- Anatomy  Cranium:               Appears normal         Aortic Arch:            Appears  normal  Cavum:                 Appears normal         Ductal Arch:            Appears normal  Ventricles:  Appears normal         Diaphragm:              Appears normal  Choroid Plexus:        Appears normal         Stomach:                Appears normal, left                                                                        sided  Cerebellum:            Appears normal         Abdomen:                Appears normal  Posterior Fossa:       Appears normal         Abdominal Wall:         Appears nml (cord                                                                        insert, abd wall)  Nuchal Fold:           Appears normal         Cord Vessels:           Appears normal (3                                                                        vessel cord)  Face:                  Appears normal         Kidneys:                Appear normal                         (orbits and profile)  Lips:                  Appears normal         Bladder:                Appears normal  Thoracic:              Appears normal         Spine:                  Appears normal  Heart:                 Appears normal         Upper Extremities:      Appears normal                         (  4CH, axis, and                         situs)  RVOT:                  Appears normal         Lower Extremities:      Appears normal  LVOT:                  Appears normal  Other:  Fetus appears to be a female. Heels and LT 5th digit visualized.          Technically difficult due to fetal position. ---------------------------------------------------------------------- Cervix Uterus Adnexa  Cervix  Measured transvaginally.  Uterus  No abnormality visualized.  Left Ovary  Within normal limits.  Right Ovary  Within normal limits.  Adnexa:       No abnormality visualized. ---------------------------------------------------------------------- Impression  SIUP at 19+5 weeks  Normal detailed fetal anatomy  Markers of aneuploidy: none  Normal amniotic  fluid volume  Measurements consistent with prior US  EV views of cervix: normal length without funneling (mild  contraction in LUS) ---------------------------------------------------------------------- Recommendations  Follow-up ultrasound for cervical length in 2 and 4  weeks  Growth [place] in 4 weeks ----------------------------------------------------------------------                 Particia Nearing, MD Electronically Signed Final Report   06/12/2017 05:56 pm ----------------------------------------------------------------------    Assessment and Plan:  Pregnancy: G3P0110 at [redacted]w[redacted]d  1. H/O pre-term labor MFM recommended 17P - Unclear why patient has not been started on 17P, however, ordered today stat - ordered for vaginal progesterone until 17P is started - progesterone (PROMETRIUM) 200 MG capsule; Place one capsule vaginally at bedtime  Dispense: 30 capsule; Refill: 0 TVUS scheduled for 10/26  2. Supervision of high risk pregnancy, antepartum   Preterm labor symptoms and general obstetric precautions including but not limited to vaginal bleeding, contractions, leaking of fluid and fetal movement were reviewed in detail with the patient. Please refer to After Visit Summary for other counseling recommendations.  Return in about 3 weeks (around 07/15/2017).   Conan Bowens, MD

## 2017-06-25 NOTE — Progress Notes (Signed)
Contacted [facility] in regards to getting pt's medication sent asap so that the pt can start getting medication due to her history.  Derlim, [facility] care manager,  informed me that she has received all documents that are needed and placed me on hold to speak with pharmacist about shipment.  Received notification that the pharmacy will need to contact the pt prior to shipment.  I contacted the pt to inform her that she will be receiving a call from the pharmacy to please pick up so that she can speak them so that [facility]  Pt stated understanding.  Contacted the pharmacy @ [phone] and spoke with [place]any who informed me that the pt's medication was shipped as of 1415 today (06/25/17) and we should have it tomorrrow in the am.

## 2017-06-26 ENCOUNTER — Other Ambulatory Visit (HOSPITAL_COMMUNITY): Payer: Self-pay | Admitting: Maternal and Fetal Medicine

## 2017-06-26 ENCOUNTER — Encounter (HOSPITAL_COMMUNITY): Payer: Self-pay

## 2017-06-26 ENCOUNTER — Ambulatory Visit (HOSPITAL_COMMUNITY)
Admission: RE | Admit: 2017-06-26 | Discharge: 2017-06-26 | Disposition: A | Discharge status: Home / Self Care | Payer: Medicaid Other | Source: Ambulatory Visit | Attending: Advanced Practice Midwife | Admitting: Advanced Practice Midwife

## 2017-06-26 ENCOUNTER — Ambulatory Visit (INDEPENDENT_AMBULATORY_CARE_PROVIDER_SITE_OTHER): Payer: Medicaid Other | Admitting: *Deleted

## 2017-06-26 DIAGNOSIS — O281 Abnormal biochemical finding on antenatal screening of mother: Secondary | ICD-10-CM

## 2017-06-26 DIAGNOSIS — O09219 Supervision of pregnancy with history of pre-term labor, unspecified trimester: Secondary | ICD-10-CM

## 2017-06-26 DIAGNOSIS — Z3686 Encounter for antenatal screening for cervical length: Secondary | ICD-10-CM | POA: Insufficient documentation

## 2017-06-26 DIAGNOSIS — Z3A21 21 weeks gestation of pregnancy: Secondary | ICD-10-CM | POA: Diagnosis not present

## 2017-06-26 DIAGNOSIS — O09212 Supervision of pregnancy with history of pre-term labor, second trimester: Secondary | ICD-10-CM | POA: Diagnosis present

## 2017-06-26 DIAGNOSIS — O09299 Supervision of pregnancy with other poor reproductive or obstetric history, unspecified trimester: Secondary | ICD-10-CM | POA: Diagnosis not present

## 2017-06-26 DIAGNOSIS — O09292 Supervision of pregnancy with other poor reproductive or obstetric history, second trimester: Secondary | ICD-10-CM

## 2017-06-26 DIAGNOSIS — O0990 Supervision of high risk pregnancy, unspecified, unspecified trimester: Secondary | ICD-10-CM

## 2017-06-26 MED ORDER — HYDROXYPROGESTERONE CAPROATE 275 MG/1.1ML ~~LOC~~ SOAJ
275.0000 mg | SUBCUTANEOUS | Status: DC
  Administered 2017-06-26 – 2017-10-08 (×16): 275 mg via SUBCUTANEOUS

## 2017-06-26 NOTE — Progress Notes (Signed)
Pt presents to clinic for first 17-p injection. Tolerated well.

## 2017-07-03 ENCOUNTER — Ambulatory Visit (INDEPENDENT_AMBULATORY_CARE_PROVIDER_SITE_OTHER): Payer: Medicaid Other | Admitting: *Deleted

## 2017-07-03 VITALS — BP 109/52 | HR 85 | Wt 132.7 lb

## 2017-07-03 DIAGNOSIS — Z368A Encounter for antenatal screening for other genetic defects: Secondary | ICD-10-CM

## 2017-07-03 DIAGNOSIS — O09292 Supervision of pregnancy with other poor reproductive or obstetric history, second trimester: Secondary | ICD-10-CM

## 2017-07-03 DIAGNOSIS — O09212 Supervision of pregnancy with history of pre-term labor, second trimester: Secondary | ICD-10-CM

## 2017-07-03 NOTE — Progress Notes (Signed)
Agree w/ POC for 17-P and AFP. Continue weekly 17-P until 36 wks.   Katrinka BlazingSmith, [place], CNM 07/03/2017 9:52 AM

## 2017-07-03 NOTE — Progress Notes (Signed)
Agree w/ POC. Called to remind office staff to offer AFP at Next injection visit 11/2.  Katrinka BlazingSmith, [place], CNM 07/03/2017 8:30 AM

## 2017-07-03 NOTE — Progress Notes (Signed)
Patient presents to clinic for 17-p. Tolerated well. Per [place], pt needs AFP, offered to patient. She agrees, to lab for blood draw.

## 2017-07-07 LAB — AFP TETRA
DIA VALUE (EIA): 254.27 pg/mL
DSR (By Age)    1 IN: 1155
Gestational Age: 22.5 WEEKS
MATERNAL AGE AT EDD: 20.6 a
MSAFP Mom: 1.01
MSAFP: 94.8 ng/mL
MSHCG: 1957 m[IU]/mL
Osb Risk: 10000
UE3 VALUE: 1.73 ng/mL
Weight: 133 [lb_av]

## 2017-07-10 ENCOUNTER — Encounter (HOSPITAL_COMMUNITY): Payer: Self-pay

## 2017-07-10 ENCOUNTER — Other Ambulatory Visit (HOSPITAL_COMMUNITY): Payer: Self-pay | Admitting: Maternal and Fetal Medicine

## 2017-07-10 ENCOUNTER — Ambulatory Visit (HOSPITAL_COMMUNITY)
Admission: RE | Admit: 2017-07-10 | Discharge: 2017-07-10 | Disposition: A | Discharge status: Home / Self Care | Payer: Medicaid Other | Source: Ambulatory Visit | Attending: Advanced Practice Midwife | Admitting: Advanced Practice Midwife

## 2017-07-10 ENCOUNTER — Ambulatory Visit (INDEPENDENT_AMBULATORY_CARE_PROVIDER_SITE_OTHER): Payer: Medicaid Other

## 2017-07-10 DIAGNOSIS — O281 Abnormal biochemical finding on antenatal screening of mother: Secondary | ICD-10-CM | POA: Insufficient documentation

## 2017-07-10 DIAGNOSIS — Z3A23 23 weeks gestation of pregnancy: Secondary | ICD-10-CM | POA: Insufficient documentation

## 2017-07-10 DIAGNOSIS — Z3686 Encounter for antenatal screening for cervical length: Secondary | ICD-10-CM | POA: Insufficient documentation

## 2017-07-10 DIAGNOSIS — O09219 Supervision of pregnancy with history of pre-term labor, unspecified trimester: Secondary | ICD-10-CM

## 2017-07-10 DIAGNOSIS — O09212 Supervision of pregnancy with history of pre-term labor, second trimester: Secondary | ICD-10-CM | POA: Insufficient documentation

## 2017-07-10 DIAGNOSIS — O09292 Supervision of pregnancy with other poor reproductive or obstetric history, second trimester: Secondary | ICD-10-CM

## 2017-07-10 DIAGNOSIS — O0990 Supervision of high risk pregnancy, unspecified, unspecified trimester: Secondary | ICD-10-CM

## 2017-07-10 DIAGNOSIS — Z8751 Personal history of pre-term labor: Secondary | ICD-10-CM

## 2017-07-10 NOTE — Progress Notes (Signed)
Patient presented to the office for a 17p. Patient tolerated well.

## 2017-07-16 ENCOUNTER — Ambulatory Visit (INDEPENDENT_AMBULATORY_CARE_PROVIDER_SITE_OTHER): Payer: Medicaid Other | Admitting: Advanced Practice Midwife

## 2017-07-16 VITALS — BP 97/51 | HR 76 | Wt 133.7 lb

## 2017-07-16 DIAGNOSIS — O09292 Supervision of pregnancy with other poor reproductive or obstetric history, second trimester: Secondary | ICD-10-CM | POA: Diagnosis not present

## 2017-07-16 DIAGNOSIS — Z23 Encounter for immunization: Secondary | ICD-10-CM | POA: Diagnosis not present

## 2017-07-16 DIAGNOSIS — O0990 Supervision of high risk pregnancy, unspecified, unspecified trimester: Secondary | ICD-10-CM

## 2017-07-16 DIAGNOSIS — O0992 Supervision of high risk pregnancy, unspecified, second trimester: Secondary | ICD-10-CM

## 2017-07-16 NOTE — Patient Instructions (Addendum)
Second Trimester of Pregnancy The second trimester is from week 13 through week 28, month 4 through 6. This is often the time in pregnancy that you feel your best. Often times, morning sickness has lessened or quit. You may have more energy, and you may get hungry more often. Your unborn baby (fetus) is growing rapidly. At the end of the sixth month, he or she is about 9 inches long and weighs about 1 pounds. You will likely feel the baby move (quickening) between 18 and 20 weeks of pregnancy. Follow these instructions at home:  Avoid all smoking, herbs, and alcohol. Avoid drugs not approved by your doctor.  Do not use any tobacco products, including cigarettes, chewing tobacco, and electronic cigarettes. If you need help quitting, ask your doctor. You may get counseling or other support to help you quit.  Only take medicine as told by your doctor. Some medicines are safe and some are not during pregnancy.  Exercise only as told by your doctor. Stop exercising if you start having cramps.  Eat regular, healthy meals.  Wear a good support bra if your breasts are tender.  Do not use hot tubs, steam rooms, or saunas.  Wear your seat belt when driving.  Avoid raw meat, uncooked cheese, and liter boxes and soil used by cats.  Take your prenatal vitamins.  Take 1500-2000 milligrams of calcium daily starting at the 20th week of pregnancy until you deliver your baby.  Try taking medicine that helps you poop (stool softener) as needed, and if your doctor approves. Eat more fiber by eating fresh fruit, vegetables, and whole grains. Drink enough fluids to keep your pee (urine) clear or pale yellow.  Take warm water baths (sitz baths) to soothe pain or discomfort caused by hemorrhoids. Use hemorrhoid cream if your doctor approves.  If you have puffy, bulging veins (varicose veins), wear support hose. Raise (elevate) your feet for 15 minutes, 3-4 times a day. Limit salt in your diet.  Avoid heavy  lifting, wear low heals, and sit up straight.  Rest with your legs raised if you have leg cramps or low back pain.  Visit your dentist if you have not gone during your pregnancy. Use a soft toothbrush to brush your teeth. Be gentle when you floss.  You can have sex (intercourse) unless your doctor tells you not to.  Go to your doctor visits. Get help if:  You feel dizzy.  You have mild cramps or pressure in your lower belly (abdomen).  You have a nagging pain in your belly area.  You continue to feel sick to your stomach (nauseous), throw up (vomit), or have watery poop (diarrhea).  You have bad smelling fluid coming from your vagina.  You have pain with peeing (urination). Get help right away if:  You have a fever.  You are leaking fluid from your vagina.  You have spotting or bleeding from your vagina.  You have severe belly cramping or pain.  You lose or gain weight rapidly.  You have trouble catching your breath and have chest pain.  You notice sudden or extreme puffiness (swelling) of your face, hands, ankles, feet, or legs.  You have not felt the baby move in over an hour.  You have severe headaches that do not go away with medicine.  You have vision changes. This information is not intended to replace advice given to you by your health care provider. Make sure you discuss any questions you have with your health care   provider. Document Released: 11/12/2009 Document Revised: 01/24/2016 Document Reviewed: 10/19/2012 Elsevier Interactive Patient Education  2017 Elsevier Inc.  AREA PEDIATRIC/FAMILY PRACTICE PHYSICIANS  [facility] [address], [address] [place], [place]  [address] Phone - [phone]   Fax - [phone]  [facility] [address] [place], [place] [address] Phone - [phone]   Fax - [phone]  [facility] [address] [place], [place]  [address] Phone - [phone]   Fax -  [phone]  [facility] [address] [place], [place]  [address] Phone - [phone]   Fax - [phone]  [facility] [address] [place], [place]  [address] Phone - [phone]   Fax - [phone]  [facility] [address], [address] [place], [place]  [address] Phone - [phone]   Fax - [phone]  [facility] [address] [place], [place]  [address] Phone - [phone]   Fax - [phone]  [facility] [address], [address] [place], [place]  [address] Phone - [phone]   Fax - [phone]  [facility] [address] [place], [place]  [address] Phone - [phone]   Fax - [phone] [facility] [address] [place], [place]  [address] Phone - [phone]   Fax - [phone]  [facility] [address] [place], [place]  [address] Phone - [phone]   Fax - [phone]  [facility] [address] [place], [place]  [address] Phone - [phone]   Fax - [phone]  [facility] [address], [address] [place], [place]  [address] Phone - [phone]   Fax - [phone]  [facility] [address] [place], [place]  [address] Phone - [phone]  [facility] [address] [place], [place]  [address] Phone - [phone]   Fax - [phone]  [facility] [address] [place], [place]  [address] Phone - [phone]   Fax - [phone]  [facility] [address] [place], [place]  [address] Phone - [phone]   Fax - [phone]  [facility] [address] [place], [place]  [address] Phone - [phone]   Fax - [phone]   [facility] [address] [place], [place]   [address] Phone - [phone]   Fax - [phone]  [facility] [address] [place], [place]  [address] Phone - [phone]   Fax - [phone]  [facility] [address] [place], [place]  [address] Phone - [phone]   Fax - [phone]  [facility] [address] [place], [place]  [address] Phone - [phone]   Fax - [phone]  [facility] [address] [place], [place]  [address] Phone - [phone]   Fax - [phone] [facility] [address] [place], [place]  [address] Phone - [phone]   Fax - [phone]  [facility] [address] [place], [place]  [address] Phone - [phone]   Fax - [phone]  [facility] [address] [place],   [address] Phone - [phone]   Fax - [phone]  [facility] Charlene Flemming MD [address] [place]  [address] Phone   [phone]  Fax [phone]   

## 2017-07-16 NOTE — Progress Notes (Signed)
   PRENATAL VISIT NOTE  Subjective:  Mackenzie Lambert is a 21 y.o. G3P0110 at 5089w4d being seen today for ongoing prenatal care.  She is currently monitored for the following issues for this high-risk pregnancy and has Supervision of high risk pregnancy, antepartum and Prior perinatal loss in second trimester, antepartum on their problem list.  Patient reports no complaints.  Contractions: Not present. Vag. Bleeding: None.  Movement: Present. Denies leaking of fluid.   The following portions of the patient's history were reviewed and updated as appropriate: allergies, current medications, past family history, past medical history, past social history, past surgical history and problem list. Problem list updated.  Objective:   Vitals:   07/16/17 1112  BP: (!) 97/51  Pulse: 76  Weight: 133 lb 11.2 oz (60.6 kg)    Fetal Status: Fetal Heart Rate (bpm): 160 Fundal Height: 25 cm Movement: Present     General:  Alert, oriented and cooperative. Patient is in no acute distress.  Skin: Skin is warm and dry. No rash noted.   Cardiovascular: Normal heart rate noted  Respiratory: Normal respiratory effort, no problems with respiration noted  Abdomen: Soft, gravid, appropriate for gestational age.  Pain/Pressure: Present     Pelvic: Cervical exam deferred        Extremities: Normal range of motion.  Edema: Mild pitting, slight indentation  Mental Status:  Normal mood and affect. Normal behavior. Normal judgment and thought content.   Assessment and Plan:  Pregnancy: G3P0110 at 6489w4d  1. Supervision of high risk pregnancy, antepartum   2. Prior perinatal loss in second trimester, antepartum--second trimester unknown if IUFD vs incompetent cervix vs PROM  - CL 3.5 cm at 24 weeks. Follow-up as clinically indicated per MFM - Continue 17-P - Antenatal testing not indicated.   Preterm labor symptoms and general obstetric precautions including but not limited to vaginal bleeding, contractions,  leaking of fluid and fetal movement were reviewed in detail with the patient. Please refer to After Visit Summary for other counseling recommendations.  Return in about 4 weeks (around 08/13/2017) for obfu +17p- needs fasting appt for gtt, ROB/GTT.   Dorathy KinsmanVirginia Calianne Larue, CNM

## 2017-07-21 ENCOUNTER — Telehealth: Payer: Self-pay | Admitting: General Practice

## 2017-07-21 ENCOUNTER — Ambulatory Visit (INDEPENDENT_AMBULATORY_CARE_PROVIDER_SITE_OTHER): Payer: Medicaid Other | Admitting: General Practice

## 2017-07-21 DIAGNOSIS — O09292 Supervision of pregnancy with other poor reproductive or obstetric history, second trimester: Secondary | ICD-10-CM

## 2017-07-21 DIAGNOSIS — O09212 Supervision of pregnancy with history of pre-term labor, second trimester: Secondary | ICD-10-CM

## 2017-07-21 NOTE — Telephone Encounter (Signed)
Telephone call to patient to inform her 17p has arrived in office. Patient verbalized understanding and states she will be here around 3pm. Patient had no questions

## 2017-07-21 NOTE — Progress Notes (Signed)
Agree. Aviva SignsWilliams, Marie L, CNM

## 2017-07-30 ENCOUNTER — Ambulatory Visit (INDEPENDENT_AMBULATORY_CARE_PROVIDER_SITE_OTHER): Payer: Medicaid Other | Admitting: General Practice

## 2017-07-30 DIAGNOSIS — O09292 Supervision of pregnancy with other poor reproductive or obstetric history, second trimester: Secondary | ICD-10-CM

## 2017-07-30 NOTE — Progress Notes (Signed)
17P injection given to patient as per protocol by RN.  Agree with nursing staff's administration of this medication.  Jaynie CollinsUgonna Beren Yniguez, MD 07/30/2017 9:41 AM

## 2017-08-06 ENCOUNTER — Ambulatory Visit: Payer: Medicaid Other

## 2017-08-07 ENCOUNTER — Ambulatory Visit (INDEPENDENT_AMBULATORY_CARE_PROVIDER_SITE_OTHER): Payer: Medicaid Other | Admitting: *Deleted

## 2017-08-07 DIAGNOSIS — O09212 Supervision of pregnancy with history of pre-term labor, second trimester: Secondary | ICD-10-CM | POA: Diagnosis present

## 2017-08-07 DIAGNOSIS — Z8751 Personal history of pre-term labor: Secondary | ICD-10-CM

## 2017-08-07 NOTE — Progress Notes (Signed)
Patient presents to clinic for 17-p injection. Tolerated well. 

## 2017-08-13 ENCOUNTER — Encounter: Payer: Self-pay | Admitting: Obstetrics and Gynecology

## 2017-08-13 ENCOUNTER — Ambulatory Visit (INDEPENDENT_AMBULATORY_CARE_PROVIDER_SITE_OTHER): Payer: Medicaid Other | Admitting: Obstetrics and Gynecology

## 2017-08-13 VITALS — BP 106/54 | HR 87 | Wt 139.3 lb

## 2017-08-13 DIAGNOSIS — O0993 Supervision of high risk pregnancy, unspecified, third trimester: Secondary | ICD-10-CM | POA: Diagnosis not present

## 2017-08-13 DIAGNOSIS — O09293 Supervision of pregnancy with other poor reproductive or obstetric history, third trimester: Secondary | ICD-10-CM

## 2017-08-13 DIAGNOSIS — O0990 Supervision of high risk pregnancy, unspecified, unspecified trimester: Secondary | ICD-10-CM

## 2017-08-13 DIAGNOSIS — Z23 Encounter for immunization: Secondary | ICD-10-CM

## 2017-08-13 DIAGNOSIS — O09292 Supervision of pregnancy with other poor reproductive or obstetric history, second trimester: Secondary | ICD-10-CM

## 2017-08-13 MED ORDER — VITAFOL GUMMIES 3.33-0.333-34.8 MG PO CHEW
1.0000 | CHEWABLE_TABLET | Freq: Every day | ORAL | 5 refills | Status: DC
Start: 2017-08-13 — End: 2020-11-06

## 2017-08-13 NOTE — Progress Notes (Signed)
Pt is taking gummy vit without iron, should she be taking iron? 28 week labs/tdap/17-p today

## 2017-08-13 NOTE — Patient Instructions (Signed)
Contraception Choices Contraception (birth control) is the use of any methods or devices to prevent pregnancy. Below are some methods to help avoid pregnancy. Hormonal methods  Contraceptive implant. This is a thin, plastic tube containing progesterone hormone. It does not contain estrogen hormone. Your health care provider inserts the tube in the inner part of the upper arm. The tube can remain in place for up to 3 years. After 3 years, the implant must be removed. The implant prevents the ovaries from releasing an egg (ovulation), thickens the cervical mucus to prevent sperm from entering the uterus, and thins the lining of the inside of the uterus.  Progesterone-only injections. These injections are given every 3 months by your health care provider to prevent pregnancy. This synthetic progesterone hormone stops the ovaries from releasing eggs. It also thickens cervical mucus and changes the uterine lining. This makes it harder for sperm to survive in the uterus.  Birth control pills. These pills contain estrogen and progesterone hormone. They work by preventing the ovaries from releasing eggs (ovulation). They also cause the cervical mucus to thicken, preventing the sperm from entering the uterus. Birth control pills are prescribed by a health care provider.Birth control pills can also be used to treat heavy periods.  Minipill. This type of birth control pill contains only the progesterone hormone. They are taken every day of each month and must be prescribed by your health care provider.  Birth control patch. The patch contains hormones similar to those in birth control pills. It must be changed once a week and is prescribed by a health care provider.  Vaginal ring. The ring contains hormones similar to those in birth control pills. It is left in the vagina for 3 weeks, removed for 1 week, and then a new one is put back in place. The patient must be comfortable inserting and removing the ring from  the vagina.A health care provider's prescription is necessary.  Emergency contraception. Emergency contraceptives prevent pregnancy after unprotected sexual intercourse. This pill can be taken right after sex or up to 5 days after unprotected sex. It is most effective the sooner you take the pills after having sexual intercourse. Most emergency contraceptive pills are available without a prescription. Check with your pharmacist. Do not use emergency contraception as your only form of birth control. Barrier methods  Female condom. This is a thin sheath (latex or rubber) that is worn over the penis during sexual intercourse. It can be used with spermicide to increase effectiveness.  Female condom. This is a soft, loose-fitting sheath that is put into the vagina before sexual intercourse.  Diaphragm. This is a soft, latex, dome-shaped barrier that must be fitted by a health care provider. It is inserted into the vagina, along with a spermicidal jelly. It is inserted before intercourse. The diaphragm should be left in the vagina for 6 to 8 hours after intercourse.  Cervical cap. This is a round, soft, latex or plastic cup that fits over the cervix and must be fitted by a health care provider. The cap can be left in place for up to 48 hours after intercourse.  Sponge. This is a soft, circular piece of polyurethane foam. The sponge has spermicide in it. It is inserted into the vagina after wetting it and before sexual intercourse.  Spermicides. These are chemicals that kill or block sperm from entering the cervix and uterus. They come in the form of creams, jellies, suppositories, foam, or tablets. They do not require a prescription. They   are inserted into the vagina with an applicator before having sexual intercourse. The process must be repeated every time you have sexual intercourse. Intrauterine contraception  Intrauterine device (IUD). This is a T-shaped device that is put in a woman's uterus during  a menstrual period to prevent pregnancy. There are 2 types: ? Copper IUD. This type of IUD is wrapped in copper wire and is placed inside the uterus. Copper makes the uterus and fallopian tubes produce a fluid that kills sperm. It can stay in place for 10 years. ? Hormone IUD. This type of IUD contains the hormone progestin (synthetic progesterone). The hormone thickens the cervical mucus and prevents sperm from entering the uterus, and it also thins the uterine lining to prevent implantation of a fertilized egg. The hormone can weaken or kill the sperm that get into the uterus. It can stay in place for 3-5 years, depending on which type of IUD is used. Permanent methods of contraception  Female tubal ligation. This is when the woman's fallopian tubes are surgically sealed, tied, or blocked to prevent the egg from traveling to the uterus.  Hysteroscopic sterilization. This involves placing a small coil or insert into each fallopian tube. Your doctor uses a technique called hysteroscopy to do the procedure. The device causes scar tissue to form. This results in permanent blockage of the fallopian tubes, so the sperm cannot fertilize the egg. It takes about 3 months after the procedure for the tubes to become blocked. You must use another form of birth control for these 3 months.  Female sterilization. This is when the female has the tubes that carry sperm tied off (vasectomy).This blocks sperm from entering the vagina during sexual intercourse. After the procedure, the man can still ejaculate fluid (semen). Natural planning methods  Natural family planning. This is not having sexual intercourse or using a barrier method (condom, diaphragm, cervical cap) on days the woman could become pregnant.  Calendar method. This is keeping track of the length of each menstrual cycle and identifying when you are fertile.  Ovulation method. This is avoiding sexual intercourse during ovulation.  Symptothermal method.  This is avoiding sexual intercourse during ovulation, using a thermometer and ovulation symptoms.  Post-ovulation method. This is timing sexual intercourse after you have ovulated. Regardless of which type or method of contraception you choose, it is important that you use condoms to protect against the transmission of sexually transmitted infections (STIs). Talk with your health care provider about which form of contraception is most appropriate for you. This information is not intended to replace advice given to you by your health care provider. Make sure you discuss any questions you have with your health care provider. Document Released: 08/18/2005 Document Revised: 01/24/2016 Document Reviewed: 02/10/2013 Elsevier Interactive Patient Education  2017 Elsevier Inc.  

## 2017-08-13 NOTE — Progress Notes (Signed)
    PRENATAL VISIT NOTE  Subjective:  Mackenzie Lambert is a 21 y.o. G3P0110 at 1465w4d being seen today for ongoing prenatal care.  She is currently monitored for the following issues for this high-risk pregnancy and has Supervision of high risk pregnancy, antepartum and Prior perinatal loss in second trimester, antepartum on their problem list.  Patient reports no complaints.  Contractions: Not present. Vag. Bleeding: None.  Movement: Present. Denies leaking of fluid.   The following portions of the patient's history were reviewed and updated as appropriate: allergies, current medications, past family history, past medical history, past social history, past surgical history and problem list. Problem list updated.  Objective:   Vitals:   08/13/17 0802  BP: (!) 106/54  Pulse: 87  Weight: 139 lb 4.8 oz (63.2 kg)    Fetal Status: Fetal Heart Rate (bpm): 147 Fundal Height: 27 cm Movement: Present     General:  Alert, oriented and cooperative. Patient is in no acute distress.  Skin: Skin is warm and dry. No rash noted.   Cardiovascular: Normal heart rate noted  Respiratory: Normal respiratory effort, no problems with respiration noted  Abdomen: Soft, gravid, appropriate for gestational age.  Pain/Pressure: Absent     Pelvic: Cervical exam deferred        Extremities: Normal range of motion.  Edema: None  Mental Status:  Normal mood and affect. Normal behavior. Normal judgment and thought content.   Assessment and Plan:  Pregnancy: G3P0110 at 2665w4d  1. Supervision of high risk pregnancy, antepartum, third trimester - Glucose Tolerance, 2 Hours w/1 Hour - HIV antibody (with reflex) - RPR - CBC Reviewed options for birth control including oral contraceptive pills (combination and progesterone only), NuvaRing, Depo-Provera, Nexplanon, IUDs (copper and levonorgestrol). Thoroughly reviewed risks/benefits/side effects of each. Answered all questions.  2. Need for  diphtheria-tetanus-pertussis (Tdap) vaccine - Tdap vaccine greater than or equal to 7yo IM  4. Prior perinatal loss in second trimester, antepartum CL @ 24 weeks wnl  Preterm labor symptoms and general obstetric precautions including but not limited to vaginal bleeding, contractions, leaking of fluid and fetal movement were reviewed in detail with the patient. Please refer to After Visit Summary for other counseling recommendations.  Return in about 2 weeks (around 08/27/2017) for OB visit.   Conan BowensKelly M Davis, MD

## 2017-08-14 LAB — CBC
HEMATOCRIT: 33.6 % — AB (ref 34.0–46.6)
HEMOGLOBIN: 10.8 g/dL — AB (ref 11.1–15.9)
MCH: 28.3 pg (ref 26.6–33.0)
MCHC: 32.1 g/dL (ref 31.5–35.7)
MCV: 88 fL (ref 79–97)
Platelets: 216 10*3/uL (ref 150–379)
RBC: 3.81 x10E6/uL (ref 3.77–5.28)
RDW: 14.4 % (ref 12.3–15.4)
WBC: 8.9 10*3/uL (ref 3.4–10.8)

## 2017-08-14 LAB — GLUCOSE TOLERANCE, 2 HOURS W/ 1HR
GLUCOSE, FASTING: 75 mg/dL (ref 65–91)
Glucose, 1 hour: 114 mg/dL (ref 65–179)
Glucose, 2 hour: 92 mg/dL (ref 65–152)

## 2017-08-14 LAB — RPR: RPR: NONREACTIVE

## 2017-08-14 LAB — HIV ANTIBODY (ROUTINE TESTING W REFLEX): HIV Screen 4th Generation wRfx: NONREACTIVE

## 2017-08-20 ENCOUNTER — Ambulatory Visit (INDEPENDENT_AMBULATORY_CARE_PROVIDER_SITE_OTHER): Payer: Medicaid Other | Admitting: General Practice

## 2017-08-20 DIAGNOSIS — O09292 Supervision of pregnancy with other poor reproductive or obstetric history, second trimester: Secondary | ICD-10-CM

## 2017-08-20 NOTE — Progress Notes (Signed)
17p given 

## 2017-08-26 ENCOUNTER — Ambulatory Visit (INDEPENDENT_AMBULATORY_CARE_PROVIDER_SITE_OTHER): Payer: Medicaid Other | Admitting: Obstetrics & Gynecology

## 2017-08-26 VITALS — BP 100/58 | HR 77 | Wt 139.8 lb

## 2017-08-26 DIAGNOSIS — O0990 Supervision of high risk pregnancy, unspecified, unspecified trimester: Secondary | ICD-10-CM

## 2017-08-26 DIAGNOSIS — O09292 Supervision of pregnancy with other poor reproductive or obstetric history, second trimester: Secondary | ICD-10-CM

## 2017-08-26 DIAGNOSIS — O0993 Supervision of high risk pregnancy, unspecified, third trimester: Secondary | ICD-10-CM

## 2017-08-26 DIAGNOSIS — O09293 Supervision of pregnancy with other poor reproductive or obstetric history, third trimester: Secondary | ICD-10-CM

## 2017-08-26 NOTE — Progress Notes (Signed)
   PRENATAL VISIT NOTE  Subjective:  Mackenzie Lambert is a 21 y.o. G3P0110 at 3349w3d being seen today for ongoing prenatal care.  She is currently monitored for the following issues for this high-risk pregnancy and has Supervision of high risk pregnancy, antepartum and Prior perinatal loss in second trimester, antepartum on their problem list.  Patient reports no complaints.  Contractions: Not present. Vag. Bleeding: None.  Movement: Present. Denies leaking of fluid.   The following portions of the patient's history were reviewed and updated as appropriate: allergies, current medications, past family history, past medical history, past social history, past surgical history and problem list. Problem list updated.  Objective:   Vitals:   08/26/17 1310  BP: (!) 100/58  Pulse: 77  Weight: 139 lb 12.8 oz (63.4 kg)    Fetal Status: Fetal Heart Rate (bpm): 142   Movement: Present     General:  Alert, oriented and cooperative. Patient is in no acute distress.  Skin: Skin is warm and dry. No rash noted.   Cardiovascular: Normal heart rate noted  Respiratory: Normal respiratory effort, no problems with respiration noted  Abdomen: Soft, gravid, appropriate for gestational age.  Pain/Pressure: Present     Pelvic: Cervical exam deferred        Extremities: Normal range of motion.  Edema: Trace  Mental Status:  Normal mood and affect. Normal behavior. Normal judgment and thought content.   Assessment and Plan:  Pregnancy: G3P0110 at 7049w3d  1. Prior perinatal loss in second trimester, antepartum Continue weekly 17P  2. Supervision of high risk pregnancy, antepartum Preterm labor symptoms and general obstetric precautions including but not limited to vaginal bleeding, contractions, leaking of fluid and fetal movement were reviewed in detail with the patient. Please refer to After Visit Summary for other counseling recommendations.  Return in about 1 week (around 09/02/2017) for 17P 2 weeks: 17P  and HOB visit.   Jaynie CollinsUgonna Vipul Cafarelli, MD

## 2017-08-26 NOTE — Patient Instructions (Addendum)
AREA PEDIATRIC/FAMILY PRACTICE PHYSICIANS  [facility] [address] [place], [place]  [address] Phone - [phone]   Fax - [phone]  [facility] [address] [place], [place] [address] Phone - [phone]   Fax - [phone]  [facility] [address] [place], [place]  [address] Phone - [phone]   Fax - [phone]  [facility] [address] [place], [place]  [address] Phone - [phone]   Fax - [phone]  [facility] [address] [place], [place]  [address] Phone - [phone]   Fax - [phone]  [facility] [address], [address] [place], [place]  [address] Phone - [phone]   Fax - [phone]  [facility] [address] [place], [place]  [address] Phone - [phone]   Fax - [phone]  [facility] [address] [place], [place]  [address] Phone - [phone]   Fax - [phone]  [facility] [address] [place], [place]  [address] Phone - [phone]   Fax - [phone] [facility] [address] [place], [place]  [address] Phone - [phone]   Fax - [phone]  [facility] [address] [place], [place]  [address] Phone - [phone]   Fax - [phone]  [facility] [address], [address] [place], [place]  [address] Phone - [phone]   Fax - [phone]  [facility] [address] [place], [place]  [address] Phone - [phone]   Fax - [phone]  [facility] [address], [address] [place], [place]  [address] Phone - [phone]  [facility] [address] [place], [place]  [address] Phone - [phone]   Fax - [phone]  [facility] [address], [address] [place], [place]  [address] Phone - [phone]   Fax - [phone]  [facility] [address] [place], [place]  [address] Phone - [phone]   Fax - [phone]  [facility] [address] [place], [place]  [address] Phone - [phone]   Fax - [phone]   [facility] [address] [place], [place]  [address] Phone - [phone]   Fax - [phone]  [facility] [address], [address] [place], [place]  [address] Phone - [phone]   Fax - [phone]  DAVID RUBIN [address] [place], [place]  [address] Phone - [phone]   Fax - [phone]  [facility] [address] [place], [place]  [address] Phone - [phone]   Fax - [phone]  [facility] [address] [place], [place]  [address] Phone - [phone]   Fax - [phone] [facility] [place] [address] [place], [place]  [address] Phone - [phone]   Fax - [phone]  [facility] [address] [place], [place]  [address] Phone - [phone]   Fax - [phone]  [facility] [place] [address] [place], [place]  [address] Phone - [phone]   Fax - [phone]  [facility] Wyvonne Lenzharlene Flemming MD [address] [place] [address] Phone [phone]  Fax [phone]   [email or web address] for prenatal classes Or call Health department

## 2017-09-01 NOTE — L&D Delivery Note (Signed)
Delivery Note Called by RN as patient is complete and pushing well with vertex at +2 stations. At 11:29 AM a viable female was delivered via Vaginal, Spontaneous (Presentation:ROA).  APGAR: 8, 8; weight pending .   Placenta status: delivered whole and intact. 3-vessel cord Cord pH: not obtained  Anesthesia:  Epidural Episiotomy: None Lacerations:  First degree Suture Repair: 4.0 Vycril Est. Blood Loss (mL):  200 cc  Mom to postpartum.  Baby to Couplet care / Skin to Skin.  Arla Boutwell 10/12/2017, 11:51 AM

## 2017-09-02 ENCOUNTER — Ambulatory Visit (INDEPENDENT_AMBULATORY_CARE_PROVIDER_SITE_OTHER): Payer: Medicaid Other | Admitting: Obstetrics and Gynecology

## 2017-09-02 ENCOUNTER — Encounter: Payer: Self-pay | Admitting: Obstetrics and Gynecology

## 2017-09-02 VITALS — BP 113/53 | HR 84 | Wt 140.5 lb

## 2017-09-02 DIAGNOSIS — O0992 Supervision of high risk pregnancy, unspecified, second trimester: Secondary | ICD-10-CM | POA: Diagnosis not present

## 2017-09-02 DIAGNOSIS — O09212 Supervision of pregnancy with history of pre-term labor, second trimester: Secondary | ICD-10-CM

## 2017-09-02 DIAGNOSIS — O09292 Supervision of pregnancy with other poor reproductive or obstetric history, second trimester: Secondary | ICD-10-CM | POA: Diagnosis not present

## 2017-09-02 DIAGNOSIS — O0990 Supervision of high risk pregnancy, unspecified, unspecified trimester: Secondary | ICD-10-CM

## 2017-09-02 MED ORDER — DOCUSATE SODIUM 100 MG PO CAPS: 100.0000 mg | ORAL_CAPSULE | Freq: Two times a day (BID) | ORAL | 2 refills | Status: DC | PRN

## 2017-09-02 NOTE — Progress Notes (Signed)
Subjective:  Mackenzie Lambert is a 22 y.o. G3P0110 at 858w3d being seen today for ongoing prenatal care.  She is currently monitored for the following issues for this high-risk pregnancy and has Supervision of high risk pregnancy, antepartum and Prior perinatal loss in second trimester, antepartum on their problem list.  Patient reports general discomforts of pregnancy.  Contractions: Not present. Vag. Bleeding: None.  Movement: Present. Denies leaking of fluid.   The following portions of the patient's history were reviewed and updated as appropriate: allergies, current medications, past family history, past medical history, past social history, past surgical history and problem list. Problem list updated.  Objective:   Vitals:   09/02/17 1134  BP: (!) 113/53  Pulse: 84  Weight: 63.7 kg (140 lb 8 oz)    Fetal Status: Fetal Heart Rate (bpm): 145   Movement: Present     General:  Alert, oriented and cooperative. Patient is in no acute distress.  Skin: Skin is warm and dry. No rash noted.   Cardiovascular: Normal heart rate noted  Respiratory: Normal respiratory effort, no problems with respiration noted  Abdomen: Soft, gravid, appropriate for gestational age. Pain/Pressure: Present     Pelvic:  Cervical exam deferred        Extremities: Normal range of motion.  Edema: None  Mental Status: Normal mood and affect. Normal behavior. Normal judgment and thought content.   Urinalysis:      Assessment and Plan:  Pregnancy: G3P0110 at 1858w3d  1. Prior perinatal loss in second trimester, antepartum Stable Continue with weekly 17 OHP  2. Supervision of high risk pregnancy, antepartum Stable  Preterm labor symptoms and general obstetric precautions including but not limited to vaginal bleeding, contractions, leaking of fluid and fetal movement were reviewed in detail with the patient. Please refer to After Visit Summary for other counseling recommendations.  Return in about 2 weeks  (around 09/16/2017) for OB visit.   Hermina StaggersErvin, Michael L, MD

## 2017-09-02 NOTE — Progress Notes (Signed)
17-p given, tolerated well

## 2017-09-02 NOTE — Patient Instructions (Signed)
Third Trimester of Pregnancy The third trimester is from week 28 through week 40 (months 7 through 9). The third trimester is a time when the unborn baby (fetus) is growing rapidly. At the end of the ninth month, the fetus is about 20 inches in length and weighs 6-10 pounds. Body changes during your third trimester Your body will continue to go through many changes during pregnancy. The changes vary from woman to woman. During the third trimester:  Your weight will continue to increase. You can expect to gain 25-35 pounds (11-16 kg) by the end of the pregnancy.  You may begin to get stretch marks on your hips, abdomen, and breasts.  You may urinate more often because the fetus is moving lower into your pelvis and pressing on your bladder.  You may develop or continue to have heartburn. This is caused by increased hormones that slow down muscles in the digestive tract.  You may develop or continue to have constipation because increased hormones slow digestion and cause the muscles that push waste through your intestines to relax.  You may develop hemorrhoids. These are swollen veins (varicose veins) in the rectum that can itch or be painful.  You may develop swollen, bulging veins (varicose veins) in your legs.  You may have increased body aches in the pelvis, back, or thighs. This is due to weight gain and increased hormones that are relaxing your joints.  You may have changes in your hair. These can include thickening of your hair, rapid growth, and changes in texture. Some women also have hair loss during or after pregnancy, or hair that feels dry or thin. Your hair will most likely return to normal after your baby is born.  Your breasts will continue to grow and they will continue to become tender. A yellow fluid (colostrum) may leak from your breasts. This is the first milk you are producing for your baby.  Your belly button may stick out.  You may notice more swelling in your hands,  face, or ankles.  You may have increased tingling or numbness in your hands, arms, and legs. The skin on your belly may also feel numb.  You may feel short of breath because of your expanding uterus.  You may have more problems sleeping. This can be caused by the size of your belly, increased need to urinate, and an increase in your body's metabolism.  You may notice the fetus "dropping," or moving lower in your abdomen (lightening).  You may have increased vaginal discharge.  You may notice your joints feel loose and you may have pain around your pelvic bone.  What to expect at prenatal visits You will have prenatal exams every 2 weeks until week 36. Then you will have weekly prenatal exams. During a routine prenatal visit:  You will be weighed to make sure you and the baby are growing normally.  Your blood pressure will be taken.  Your abdomen will be measured to track your baby's growth.  The fetal heartbeat will be listened to.  Any test results from the previous visit will be discussed.  You may have a cervical check near your due date to see if your cervix has softened or thinned (effaced).  You will be tested for Group B streptococcus. This happens between 35 and 37 weeks.  Your health care provider may ask you:  What your birth plan is.  How you are feeling.  If you are feeling the baby move.  If you have had   any abnormal symptoms, such as leaking fluid, bleeding, severe headaches, or abdominal cramping.  If you are using any tobacco products, including cigarettes, chewing tobacco, and electronic cigarettes.  If you have any questions.  Other tests or screenings that may be performed during your third trimester include:  Blood tests that check for low iron levels (anemia).  Fetal testing to check the health, activity level, and growth of the fetus. Testing is done if you have certain medical conditions or if there are problems during the  pregnancy.  Nonstress test (NST). This test checks the health of your baby to make sure there are no signs of problems, such as the baby not getting enough oxygen. During this test, a belt is placed around your belly. The baby is made to move, and its heart rate is monitored during movement.  What is false labor? False labor is a condition in which you feel small, irregular tightenings of the muscles in the womb (contractions) that usually go away with rest, changing position, or drinking water. These are called Braxton Hicks contractions. Contractions may last for hours, days, or even weeks before true labor sets in. If contractions come at regular intervals, become more frequent, increase in intensity, or become painful, you should see your health care provider. What are the signs of labor?  Abdominal cramps.  Regular contractions that start at 10 minutes apart and become stronger and more frequent with time.  Contractions that start on the top of the uterus and spread down to the lower abdomen and back.  Increased pelvic pressure and dull back pain.  A watery or bloody mucus discharge that comes from the vagina.  Leaking of amniotic fluid. This is also known as your "water breaking." It could be a slow trickle or a gush. Let your health care provider know if it has a color or strange odor. If you have any of these signs, call your health care provider right away, even if it is before your due date. Follow these instructions at home: Medicines  Follow your health care provider's instructions regarding medicine use. Specific medicines may be either safe or unsafe to take during pregnancy.  Take a prenatal vitamin that contains at least 600 micrograms (mcg) of folic acid.  If you develop constipation, try taking a stool softener if your health care provider approves. Eating and drinking  Eat a balanced diet that includes fresh fruits and vegetables, whole grains, good sources of protein  such as meat, eggs, or tofu, and low-fat dairy. Your health care provider will help you determine the amount of weight gain that is right for you.  Avoid raw meat and uncooked cheese. These carry germs that can cause birth defects in the baby.  If you have low calcium intake from food, talk to your health care provider about whether you should take a daily calcium supplement.  Eat four or five small meals rather than three large meals a day.  Limit foods that are high in fat and processed sugars, such as fried and sweet foods.  To prevent constipation: ? Drink enough fluid to keep your urine clear or pale yellow. ? Eat foods that are high in fiber, such as fresh fruits and vegetables, whole grains, and beans. Activity  Exercise only as directed by your health care provider. Most women can continue their usual exercise routine during pregnancy. Try to exercise for 30 minutes at least 5 days a week. Stop exercising if you experience uterine contractions.  Avoid heavy   lifting.  Do not exercise in extreme heat or humidity, or at high altitudes.  Wear low-heel, comfortable shoes.  Practice good posture.  You may continue to have sex unless your health care provider tells you otherwise. Relieving pain and discomfort  Take frequent breaks and rest with your legs elevated if you have leg cramps or low back pain.  Take warm sitz baths to soothe any pain or discomfort caused by hemorrhoids. Use hemorrhoid cream if your health care provider approves.  Wear a good support bra to prevent discomfort from breast tenderness.  If you develop varicose veins: ? Wear support pantyhose or compression stockings as told by your healthcare provider. ? Elevate your feet for 15 minutes, 3-4 times a day. Prenatal care  Write down your questions. Take them to your prenatal visits.  Keep all your prenatal visits as told by your health care provider. This is important. Safety  Wear your seat belt at  all times when driving.  Make a list of emergency phone numbers, including numbers for family, friends, the hospital, and police and fire departments. General instructions  Avoid cat litter boxes and soil used by cats. These carry germs that can cause birth defects in the baby. If you have a cat, ask someone to clean the litter box for you.  Do not travel far distances unless it is absolutely necessary and only with the approval of your health care provider.  Do not use hot tubs, steam rooms, or saunas.  Do not drink alcohol.  Do not use any products that contain nicotine or tobacco, such as cigarettes and e-cigarettes. If you need help quitting, ask your health care provider.  Do not use any medicinal herbs or unprescribed drugs. These chemicals affect the formation and growth of the baby.  Do not douche or use tampons or scented sanitary pads.  Do not cross your legs for long periods of time.  To prepare for the arrival of your baby: ? Take prenatal classes to understand, practice, and ask questions about labor and delivery. ? Make a trial run to the hospital. ? Visit the hospital and tour the maternity area. ? Arrange for maternity or paternity leave through employers. ? Arrange for family and friends to take care of pets while you are in the hospital. ? Purchase a rear-facing car seat and make sure you know how to install it in your car. ? Pack your hospital bag. ? Prepare the baby's nursery. Make sure to remove all pillows and stuffed animals from the baby's crib to prevent suffocation.  Visit your dentist if you have not gone during your pregnancy. Use a soft toothbrush to brush your teeth and be gentle when you floss. Contact a health care provider if:  You are unsure if you are in labor or if your water has broken.  You become dizzy.  You have mild pelvic cramps, pelvic pressure, or nagging pain in your abdominal area.  You have lower back pain.  You have persistent  nausea, vomiting, or diarrhea.  You have an unusual or bad smelling vaginal discharge.  You have pain when you urinate. Get help right away if:  Your water breaks before 37 weeks.  You have regular contractions less than 5 minutes apart before 37 weeks.  You have a fever.  You are leaking fluid from your vagina.  You have spotting or bleeding from your vagina.  You have severe abdominal pain or cramping.  You have rapid weight loss or weight gain.    You have shortness of breath with chest pain.  You notice sudden or extreme swelling of your face, hands, ankles, feet, or legs.  Your baby makes fewer than 10 movements in 2 hours.  You have severe headaches that do not go away when you take medicine.  You have vision changes. Summary  The third trimester is from week 28 through week 40, months 7 through 9. The third trimester is a time when the unborn baby (fetus) is growing rapidly.  During the third trimester, your discomfort may increase as you and your baby continue to gain weight. You may have abdominal, leg, and back pain, sleeping problems, and an increased need to urinate.  During the third trimester your breasts will keep growing and they will continue to become tender. A yellow fluid (colostrum) may leak from your breasts. This is the first milk you are producing for your baby.  False labor is a condition in which you feel small, irregular tightenings of the muscles in the womb (contractions) that eventually go away. These are called Braxton Hicks contractions. Contractions may last for hours, days, or even weeks before true labor sets in.  Signs of labor can include: abdominal cramps; regular contractions that start at 10 minutes apart and become stronger and more frequent with time; watery or bloody mucus discharge that comes from the vagina; increased pelvic pressure and dull back pain; and leaking of amniotic fluid. This information is not intended to replace advice  given to you by your health care provider. Make sure you discuss any questions you have with your health care provider. Document Released: 08/12/2001 Document Revised: 01/24/2016 Document Reviewed: 10/19/2012 Elsevier Interactive Patient Education  2017 [employer]  

## 2017-09-09 ENCOUNTER — Ambulatory Visit (INDEPENDENT_AMBULATORY_CARE_PROVIDER_SITE_OTHER): Payer: Medicaid Other | Admitting: *Deleted

## 2017-09-09 VITALS — BP 115/57 | HR 94

## 2017-09-09 DIAGNOSIS — O09213 Supervision of pregnancy with history of pre-term labor, third trimester: Secondary | ICD-10-CM | POA: Diagnosis present

## 2017-09-09 DIAGNOSIS — Z8751 Personal history of pre-term labor: Secondary | ICD-10-CM

## 2017-09-09 NOTE — Progress Notes (Signed)
Pt presents to clinic for 17-p injection. Tolerated well. 

## 2017-09-16 ENCOUNTER — Ambulatory Visit (INDEPENDENT_AMBULATORY_CARE_PROVIDER_SITE_OTHER): Payer: Medicaid Other | Admitting: General Practice

## 2017-09-16 VITALS — BP 107/60 | HR 87 | Ht 61.0 in | Wt 143.0 lb

## 2017-09-16 DIAGNOSIS — O09292 Supervision of pregnancy with other poor reproductive or obstetric history, second trimester: Secondary | ICD-10-CM

## 2017-09-16 NOTE — Progress Notes (Signed)
RN documentation reviewed and agree with plan of care.  Rolm BookbinderCaroline M Neill, CNM 09/16/17 9:32 AM

## 2017-09-16 NOTE — Progress Notes (Signed)
17p given 

## 2017-09-23 ENCOUNTER — Ambulatory Visit (INDEPENDENT_AMBULATORY_CARE_PROVIDER_SITE_OTHER): Payer: Medicaid Other | Admitting: Advanced Practice Midwife

## 2017-09-23 VITALS — BP 104/66 | HR 76 | Wt 144.8 lb

## 2017-09-23 DIAGNOSIS — Z789 Other specified health status: Secondary | ICD-10-CM

## 2017-09-23 DIAGNOSIS — O09292 Supervision of pregnancy with other poor reproductive or obstetric history, second trimester: Secondary | ICD-10-CM

## 2017-09-23 DIAGNOSIS — O0990 Supervision of high risk pregnancy, unspecified, unspecified trimester: Secondary | ICD-10-CM

## 2017-09-23 DIAGNOSIS — O0992 Supervision of high risk pregnancy, unspecified, second trimester: Secondary | ICD-10-CM

## 2017-09-23 MED ORDER — BREAST PUMP MISC: 0 refills | Status: DC

## 2017-09-23 NOTE — Addendum Note (Signed)
Addended by: Dorathy KinsmanSMITH, Arshi Duarte on: 09/23/2017 10:16 AM   Modules accepted: Orders

## 2017-09-23 NOTE — Progress Notes (Addendum)
   PRENATAL VISIT NOTE  Subjective:  Mackenzie Lambert is a 22 y.o. G3P0110 at 2826w3d being seen today for ongoing prenatal care.  She is currently monitored for the following issues for this high-risk pregnancy and has Supervision of high risk pregnancy, antepartum and Prior perinatal loss in second trimester, antepartum on their problem list.  Patient reports no complaints.  Contractions: Irregular. Vag. Bleeding: None.  Movement: Present. Denies leaking of fluid.   The following portions of the patient's history were reviewed and updated as appropriate: allergies, current medications, past family history, past medical history, past social history, past surgical history and problem list. Problem list updated.  Objective:   Vitals:   09/23/17 0924  BP: 104/66  Pulse: 76  Weight: 144 lb 12.8 oz (65.7 kg)    Fetal Status: Fetal Heart Rate (bpm): 138 Fundal Height: 35 cm Movement: Present  Presentation: Vertex  General:  Alert, oriented and cooperative. Patient is in no acute distress.  Skin: Skin is warm and dry. No rash noted.   Cardiovascular: Normal heart rate noted  Respiratory: Normal respiratory effort, no problems with respiration noted  Abdomen: Soft, gravid, appropriate for gestational age.  Pain/Pressure: Present     Pelvic: Cervical exam deferred        Extremities: Normal range of motion.  Edema: None  Mental Status:  Normal mood and affect. Normal behavior. Normal judgment and thought content.   Assessment and Plan:  Pregnancy: G3P0110 at 8126w3d  1. Supervision of high risk pregnancy, antepartum  - Misc. Devices (BREAST PUMP) MISC; Dispense one breast pump for patient  Dispense: 1 each; Refill: 0  2. Prior perinatal loss in second trimester, antepartum - 17-P until 36 weeks  3. Exclusively breastfeed infant  - Misc. Devices (BREAST PUMP) MISC; Dispense one breast pump for patient  Dispense: 1 each; Refill: 0  Preterm labor symptoms and general obstetric  precautions including but not limited to vaginal bleeding, contractions, leaking of fluid and fetal movement were reviewed in detail with the patient. Please refer to After Visit Summary for other counseling recommendations.  Return in about 2 weeks (around 10/07/2017) for ROB.   Dorathy KinsmanVirginia Wen Munford, CNM

## 2017-09-23 NOTE — Patient Instructions (Addendum)
[email or web address]  Deciding about Circumcision in Baby Boys  (The Basics)  What is circumcision?  Circumcision is a surgery that removes the skin that covers the tip of the penis, called the "foreskin" Circumcision is usually done when a boy is between 651 and 3110 days old. In the [place], circumcision is common. In some other countries, fewer boys are circumcised. Circumcision is a common tradition in some religions.  Should I have my baby boy circumcised?  There is no easy answer. Circumcision has some benefits. But it also has risks. After talking with your doctor, you will have to decide for yourself what is right for your family.  What are the benefits of circumcision?  Circumcised boys seem to have slightly lower rates of: ?Urinary tract infections ?Swelling of the opening at the tip of the penis Circumcised men seem to have slightly lower rates of: ?Urinary tract infections ?Swelling of the opening at the tip of the penis ?Penis cancer ?HIV and other infections that you catch during sex ?Cervical cancer in the women they have sex with Even so, in the [place], the risks of these problems are small - even in boys and men who have not been circumcised. Plus, boys and men who are not circumcised can reduce these extra risks by: ?Cleaning their penis well ?Using condoms during sex  What are the risks of circumcision?  Risks include: ?Bleeding or infection from the surgery ?Damage to or amputation of the penis ?A chance that the doctor will cut off too much or not enough of the foreskin ?A chance that sex won't feel as good later in life Only about 1 out of every 200 circumcisions leads to problems. There is also a chance that your health insurance won't pay for circumcision.  How is circumcision done in baby boys?  First, the baby gets medicine for pain relief. This might be a cream on the skin or a shot into the base of the penis. Next, the doctor cleans the  baby's penis well. Then he or she uses special tools to cut off the foreskin. Finally, the doctor wraps a bandage (called gauze) around the baby's penis. If you have your baby circumcised, his doctor or nurse will give you instructions on how to care for him after the surgery. It is important that you follow those instructions carefully.  Places to have your son circumcised:    [facility] [phone] $480 while you are in hospital  [facility] [phone] $244 by 4 wks  [facility] [phone] $175 by 2 wks  [facility] [phone] $250 by 7 days [facility] [phone] $150 by 4 wks  These prices sometimes change but are roughly what you can expect to pay. Please call and confirm pricing.   Circumcision is considered an elective/non-medically necessary procedure. There are many reasons parents decide to have their sons circumsized. During the first year of life circumcised males have a reduced risk of urinary tract infections but after this year the rates between circumcised males and uncircumcised males are the same.  It is safe to have your son circumcised outside of the hospital and the places above perform them regularly.

## 2017-09-30 ENCOUNTER — Ambulatory Visit (INDEPENDENT_AMBULATORY_CARE_PROVIDER_SITE_OTHER): Payer: Medicaid Other | Admitting: *Deleted

## 2017-09-30 VITALS — BP 95/66 | HR 79

## 2017-09-30 DIAGNOSIS — O09213 Supervision of pregnancy with history of pre-term labor, third trimester: Secondary | ICD-10-CM

## 2017-09-30 DIAGNOSIS — O09899 Supervision of other high risk pregnancies, unspecified trimester: Secondary | ICD-10-CM

## 2017-09-30 DIAGNOSIS — O09219 Supervision of pregnancy with history of pre-term labor, unspecified trimester: Principal | ICD-10-CM

## 2017-10-08 ENCOUNTER — Other Ambulatory Visit (HOSPITAL_COMMUNITY)
Admission: RE | Admit: 2017-10-08 | Discharge: 2017-10-08 | Disposition: A | Discharge status: Home / Self Care | Payer: Medicaid Other | Source: Ambulatory Visit | Attending: Obstetrics and Gynecology | Admitting: Obstetrics and Gynecology

## 2017-10-08 ENCOUNTER — Ambulatory Visit (INDEPENDENT_AMBULATORY_CARE_PROVIDER_SITE_OTHER): Payer: Medicaid Other | Admitting: Obstetrics and Gynecology

## 2017-10-08 VITALS — BP 99/60 | HR 95 | Wt 143.8 lb

## 2017-10-08 DIAGNOSIS — Z3A36 36 weeks gestation of pregnancy: Secondary | ICD-10-CM | POA: Diagnosis not present

## 2017-10-08 DIAGNOSIS — O0993 Supervision of high risk pregnancy, unspecified, third trimester: Secondary | ICD-10-CM | POA: Insufficient documentation

## 2017-10-08 NOTE — Progress Notes (Signed)
Prenatal Visit Note Date: 10/08/2017 Clinic: [facility]  Subjective:  Mackenzie Lambert is a 22 y.o. G3P0110 at 7017w4d being seen today for ongoing prenatal ca2re.  She is currently monitored for the following issues for this high-risk pregnancy and has Supervision of high risk pregnancy, antepartum and Prior perinatal loss in second trimester, antepartum on their problem list.  Patient reports no complaints.   Contractions: Irregular. Vag. Bleeding: None.  Movement: Present. Denies leaking of fluid.   The following portions of the patient's history were reviewed and updated as appropriate: allergies, current medications, past family history, past medical history, past social history, past surgical history and problem list. Problem list updated.  Objective:   Vitals:   10/08/17 1356  BP: 99/60  Pulse: 95  Weight: 143 lb 12.8 oz (65.2 kg)    Fetal Status: Fetal Heart Rate (bpm): 154   Movement: Present     General:  Alert, oriented and cooperative. Patient is in no acute distress.  Skin: Skin is warm and dry. No rash noted.   Cardiovascular: Normal heart rate noted  Respiratory: Normal respiratory effort, no problems with respiration noted  Abdomen: Soft, gravid, appropriate for gestational age. Pain/Pressure: Present     Pelvic:  Cervical exam deferred        Extremities: Normal range of motion.  Edema: None  Mental Status: Normal mood and affect. Normal behavior. Normal judgment and thought content.   Urinalysis:      Assessment and Plan:  Pregnancy: G3P0110 at 5417w4d  1. Supervision of high risk pregnancy, antepartum, third trimester Routine care. D/w pt nv re: bc. Last 17p today - Strep Gp B NAA - Cervicovaginal ancillary only  Preterm labor symptoms and general obstetric precautions including but not limited to vaginal bleeding, contractions, leaking of fluid and fetal movement were reviewed in detail with the patient. Please refer to After Visit  Summary for other counseling recommendations.  Return in about 1 week (around 10/15/2017) for low risk ob.   Snyder BingPickens, Lashannon Bresnan, MD

## 2017-10-09 LAB — CERVICOVAGINAL ANCILLARY ONLY
CHLAMYDIA, DNA PROBE: NEGATIVE
Neisseria Gonorrhea: NEGATIVE

## 2017-10-10 LAB — STREP GP B NAA: Strep Gp B NAA: NEGATIVE

## 2017-10-12 ENCOUNTER — Other Ambulatory Visit: Payer: Self-pay

## 2017-10-12 ENCOUNTER — Inpatient Hospital Stay (HOSPITAL_COMMUNITY): Payer: Medicaid Other | Admitting: Anesthesiology

## 2017-10-12 ENCOUNTER — Encounter (HOSPITAL_COMMUNITY): Payer: Self-pay | Admitting: *Deleted

## 2017-10-12 ENCOUNTER — Inpatient Hospital Stay (HOSPITAL_COMMUNITY)
Admission: AD | Admit: 2017-10-12 | Discharge: 2017-10-14 | DRG: 807 | Disposition: A | Discharge status: Home / Self Care | Payer: Medicaid Other | Source: Ambulatory Visit | Attending: Obstetrics & Gynecology | Admitting: Obstetrics & Gynecology

## 2017-10-12 DIAGNOSIS — O9902 Anemia complicating childbirth: Principal | ICD-10-CM | POA: Diagnosis present

## 2017-10-12 DIAGNOSIS — Z3A37 37 weeks gestation of pregnancy: Secondary | ICD-10-CM

## 2017-10-12 DIAGNOSIS — O09292 Supervision of pregnancy with other poor reproductive or obstetric history, second trimester: Secondary | ICD-10-CM

## 2017-10-12 DIAGNOSIS — D649 Anemia, unspecified: Secondary | ICD-10-CM | POA: Diagnosis present

## 2017-10-12 DIAGNOSIS — Z3483 Encounter for supervision of other normal pregnancy, third trimester: Secondary | ICD-10-CM | POA: Diagnosis present

## 2017-10-12 DIAGNOSIS — O0990 Supervision of high risk pregnancy, unspecified, unspecified trimester: Secondary | ICD-10-CM

## 2017-10-12 LAB — CBC
HEMATOCRIT: 33.9 % — AB (ref 36.0–46.0)
HEMOGLOBIN: 11.2 g/dL — AB (ref 12.0–15.0)
MCH: 27.9 pg (ref 26.0–34.0)
MCHC: 33 g/dL (ref 30.0–36.0)
MCV: 84.5 fL (ref 78.0–100.0)
Platelets: 213 10*3/uL (ref 150–400)
RBC: 4.01 MIL/uL (ref 3.87–5.11)
RDW: 14.7 % (ref 11.5–15.5)
WBC: 7.6 10*3/uL (ref 4.0–10.5)

## 2017-10-12 LAB — TYPE AND SCREEN
ABO/RH(D): B POS
Antibody Screen: NEGATIVE

## 2017-10-12 LAB — RPR: RPR Ser Ql: NONREACTIVE

## 2017-10-12 LAB — POCT FERN TEST: POCT Fern Test: POSITIVE

## 2017-10-12 MED ORDER — LIDOCAINE HCL (PF) 1 % IJ SOLN
30.0000 mL | INTRAMUSCULAR | Status: DC | PRN
  Filled 2017-10-12: qty 30

## 2017-10-12 MED ORDER — TETANUS-DIPHTH-ACELL PERTUSSIS 5-2.5-18.5 LF-MCG/0.5 IM SUSP: 0.5000 mL | Freq: Once | INTRAMUSCULAR | Status: DC

## 2017-10-12 MED ORDER — PHENYLEPHRINE 40 MCG/ML (10ML) SYRINGE FOR IV PUSH (FOR BLOOD PRESSURE SUPPORT)
80.0000 ug | PREFILLED_SYRINGE | INTRAVENOUS | Status: DC | PRN
  Administered 2017-10-12: 80 ug via INTRAVENOUS
  Filled 2017-10-12: qty 5

## 2017-10-12 MED ORDER — LACTATED RINGERS IV SOLN
500.0000 mL | INTRAVENOUS | Status: DC | PRN
  Administered 2017-10-12: 500 mL via INTRAVENOUS

## 2017-10-12 MED ORDER — ONDANSETRON HCL 4 MG/2ML IJ SOLN: 4.0000 mg | INTRAMUSCULAR | Status: DC | PRN

## 2017-10-12 MED ORDER — DIPHENHYDRAMINE HCL 25 MG PO CAPS: 25.0000 mg | ORAL_CAPSULE | Freq: Four times a day (QID) | ORAL | Status: DC | PRN

## 2017-10-12 MED ORDER — EPHEDRINE 5 MG/ML INJ
10.0000 mg | INTRAVENOUS | Status: DC | PRN
  Filled 2017-10-12: qty 2

## 2017-10-12 MED ORDER — LACTATED RINGERS IV SOLN
500.0000 mL | Freq: Once | INTRAVENOUS | Status: AC
  Administered 2017-10-12: 500 mL via INTRAVENOUS

## 2017-10-12 MED ORDER — WITCH HAZEL-GLYCERIN EX PADS: 1.0000 "application " | MEDICATED_PAD | CUTANEOUS | Status: DC | PRN

## 2017-10-12 MED ORDER — ACETAMINOPHEN 325 MG PO TABS
650.0000 mg | ORAL_TABLET | ORAL | Status: DC | PRN
  Administered 2017-10-13: 650 mg via ORAL
  Filled 2017-10-12: qty 2

## 2017-10-12 MED ORDER — OXYTOCIN 40 UNITS IN LACTATED RINGERS INFUSION - SIMPLE MED
2.5000 [IU]/h | INTRAVENOUS | Status: DC
  Administered 2017-10-12: 2.5 [IU]/h via INTRAVENOUS
  Filled 2017-10-12: qty 1000

## 2017-10-12 MED ORDER — OXYTOCIN BOLUS FROM INFUSION
500.0000 mL | Freq: Once | INTRAVENOUS | Status: AC
  Administered 2017-10-12: 500 mL via INTRAVENOUS

## 2017-10-12 MED ORDER — COCONUT OIL OIL: 1.0000 "application " | TOPICAL_OIL | Status: DC | PRN

## 2017-10-12 MED ORDER — FLEET ENEMA 7-19 GM/118ML RE ENEM: 1.0000 | ENEMA | RECTAL | Status: DC | PRN

## 2017-10-12 MED ORDER — ONDANSETRON HCL 4 MG/2ML IJ SOLN: 4.0000 mg | Freq: Four times a day (QID) | INTRAMUSCULAR | Status: DC | PRN

## 2017-10-12 MED ORDER — SOD CITRATE-CITRIC ACID 500-334 MG/5ML PO SOLN: 30.0000 mL | ORAL | Status: DC | PRN

## 2017-10-12 MED ORDER — LIDOCAINE HCL (PF) 1 % IJ SOLN
INTRAMUSCULAR | Status: DC | PRN
  Administered 2017-10-12: 10 mL

## 2017-10-12 MED ORDER — SENNOSIDES-DOCUSATE SODIUM 8.6-50 MG PO TABS
2.0000 | ORAL_TABLET | ORAL | Status: DC
  Administered 2017-10-12 – 2017-10-13 (×2): 2 via ORAL
  Filled 2017-10-12 (×2): qty 2

## 2017-10-12 MED ORDER — DIBUCAINE 1 % RE OINT: 1.0000 "application " | TOPICAL_OINTMENT | RECTAL | Status: DC | PRN

## 2017-10-12 MED ORDER — DIPHENHYDRAMINE HCL 50 MG/ML IJ SOLN: 12.5000 mg | INTRAMUSCULAR | Status: DC | PRN

## 2017-10-12 MED ORDER — FENTANYL 2.5 MCG/ML BUPIVACAINE 1/10 % EPIDURAL INFUSION (WH - ANES)
14.0000 mL/h | INTRAMUSCULAR | Status: DC | PRN
  Administered 2017-10-12: 14 mL/h via EPIDURAL
  Filled 2017-10-12: qty 100

## 2017-10-12 MED ORDER — TERBUTALINE SULFATE 1 MG/ML IJ SOLN
0.2500 mg | Freq: Once | INTRAMUSCULAR | Status: DC | PRN
  Filled 2017-10-12: qty 1

## 2017-10-12 MED ORDER — ONDANSETRON HCL 4 MG PO TABS: 4.0000 mg | ORAL_TABLET | ORAL | Status: DC | PRN

## 2017-10-12 MED ORDER — PHENYLEPHRINE 40 MCG/ML (10ML) SYRINGE FOR IV PUSH (FOR BLOOD PRESSURE SUPPORT)
80.0000 ug | PREFILLED_SYRINGE | INTRAVENOUS | Status: DC | PRN
  Filled 2017-10-12: qty 10
  Filled 2017-10-12: qty 5

## 2017-10-12 MED ORDER — IBUPROFEN 600 MG PO TABS: 600.0000 mg | ORAL_TABLET | Freq: Four times a day (QID) | ORAL | Status: DC

## 2017-10-12 MED ORDER — OXYCODONE-ACETAMINOPHEN 5-325 MG PO TABS: 2.0000 | ORAL_TABLET | ORAL | Status: DC | PRN

## 2017-10-12 MED ORDER — PRENATAL MULTIVITAMIN CH
1.0000 | ORAL_TABLET | Freq: Every day | ORAL | Status: DC
  Administered 2017-10-13 – 2017-10-14 (×2): 1 via ORAL
  Filled 2017-10-12 (×2): qty 1

## 2017-10-12 MED ORDER — OXYTOCIN 40 UNITS IN LACTATED RINGERS INFUSION - SIMPLE MED
1.0000 m[IU]/min | INTRAVENOUS | Status: DC
  Administered 2017-10-12: 4 m[IU]/min via INTRAVENOUS
  Administered 2017-10-12: 2 m[IU]/min via INTRAVENOUS

## 2017-10-12 MED ORDER — FENTANYL CITRATE (PF) 100 MCG/2ML IJ SOLN
100.0000 ug | INTRAMUSCULAR | Status: DC | PRN
  Administered 2017-10-12: 100 ug via INTRAVENOUS
  Filled 2017-10-12: qty 2

## 2017-10-12 MED ORDER — IBUPROFEN 600 MG PO TABS
600.0000 mg | ORAL_TABLET | Freq: Four times a day (QID) | ORAL | Status: DC
  Administered 2017-10-12 – 2017-10-14 (×7): 600 mg via ORAL
  Filled 2017-10-12 (×7): qty 1

## 2017-10-12 MED ORDER — LACTATED RINGERS IV SOLN
INTRAVENOUS | Status: DC
  Administered 2017-10-12 (×3): via INTRAVENOUS

## 2017-10-12 MED ORDER — BENZOCAINE-MENTHOL 20-0.5 % EX AERO
1.0000 | INHALATION_SPRAY | CUTANEOUS | Status: DC | PRN
Start: 2017-10-12 — End: 2017-10-14

## 2017-10-12 MED ORDER — OXYCODONE-ACETAMINOPHEN 5-325 MG PO TABS: 1.0000 | ORAL_TABLET | ORAL | Status: DC | PRN

## 2017-10-12 MED ORDER — SIMETHICONE 80 MG PO CHEW: 80.0000 mg | CHEWABLE_TABLET | ORAL | Status: DC | PRN

## 2017-10-12 MED ORDER — ACETAMINOPHEN 325 MG PO TABS
650.0000 mg | ORAL_TABLET | ORAL | Status: DC | PRN
  Filled 2017-10-12: qty 2

## 2017-10-12 NOTE — Anesthesia Procedure Notes (Signed)
Epidural Patient location during procedure: OB  Staffing Anesthesiologist: Jaionna Weisse, MD Performed: anesthesiologist   Preanesthetic Checklist Completed: patient identified, site marked, surgical consent, pre-op evaluation, timeout performed, IV checked, risks and benefits discussed and monitors and equipment checked  Epidural Patient position: sitting Prep: DuraPrep Patient monitoring: heart rate, continuous pulse ox and blood pressure Approach: right paramedian Location: L3-L4 Injection technique: LOR saline  Needle:  Needle type: Tuohy  Needle gauge: 17 G Needle length: 9 cm and 9 Needle insertion depth: 6 cm Catheter type: closed end flexible Catheter size: 20 Guage Catheter at skin depth: 10 cm Test dose: negative  Assessment Events: blood not aspirated, injection not painful, no injection resistance, negative IV test and no paresthesia  Additional Notes Patient identified. Risks/Benefits/Options discussed with patient including but not limited to bleeding, infection, nerve damage, paralysis, failed block, incomplete pain control, headache, blood pressure changes, nausea, vomiting, reactions to medication both or allergic, itching and postpartum back pain. Confirmed with bedside nurse the patient's most recent platelet count. Confirmed with patient that they are not currently taking any anticoagulation, have any bleeding history or any family history of bleeding disorders. Patient expressed understanding and wished to proceed. All questions were answered. Sterile technique was used throughout the entire procedure. Please see nursing notes for vital signs. Test dose was given through epidural needle and negative prior to continuing to dose epidural or start infusion. Warning signs of high block given to the patient including shortness of breath, tingling/numbness in hands, complete motor block, or any concerning symptoms with instructions to call for help. Patient was given  instructions on fall risk and not to get out of bed. All questions and concerns addressed with instructions to call with any issues.     

## 2017-10-12 NOTE — MAU Note (Signed)
Pt reports LOF since 0330. Contractions became stronger after gush of fluid. + FM. Pt received 17-P last week

## 2017-10-12 NOTE — Lactation Note (Signed)
This note was copied from a baby's chart. Lactation Consultation Note  Patient Name: Mackenzie Lambert VOZDG'UToday's Date: 10/12/2017 Reason for consult: Initial assessment;Primapara;Early term 37-38.6wks;Infant < 6lbs Breastfeeding consultation services and [facility] Preterm [facility] information given and reviewed.  Newborn is 3 hours old and has not latched to breast yet.  Assisted with positioning baby skin to skin in football hold.  Baby sleepy and not showing interest in feeding. Instructed on hand expression and one small drop expressed.  Symphony pump set up and initiated.  Instructed to pump and hand express every 2-3 hours.  Discussed the possible need for formula later if baby not latching.  Encouraged to call for assist prn.  Maternal Data Has patient been taught Hand Expression?: Yes Does the patient have breastfeeding experience prior to this delivery?: No  Feeding Feeding Type: Breast Fed  LATCH Score Latch: Too sleepy or reluctant, no latch achieved, no sucking elicited.  Audible Swallowing: None  Type of Nipple: Flat  Comfort (Breast/Nipple): Soft / non-tender  Hold (Positioning): Assistance needed to correctly position infant at breast and maintain latch.  LATCH Score: 4  Interventions Interventions: Breast feeding basics reviewed;Assisted with latch;Breast compression;Adjust position;Skin to skin;Breast massage;Support pillows;Hand express;Position options  Lactation Tools Discussed/Used Pump Review: Setup, frequency, and cleaning;Milk Storage Initiated by:: LM Date initiated:: 10/12/17   Consult Status Consult Status: Follow-up Date: 10/13/17 Follow-up type: In-patient    Huston FoleyMOULDEN, Valente Fosberg S 10/12/2017, 3:24 PM

## 2017-10-12 NOTE — MAU Note (Addendum)
Pt states water broke at 0230-clear. Pt reports contractions but has not timed them. Denies vaginal bleeding. Reports good fetal movement. Pt grossly ruptured with pants soaking wet.

## 2017-10-12 NOTE — Anesthesia Postprocedure Evaluation (Signed)
Anesthesia Post Note  Patient: Mackenzie Lambert  Procedure(s) Performed: AN AD HOC LABOR EPIDURAL     Patient location during evaluation: Mother Baby Anesthesia Type: Epidural Level of consciousness: awake and alert and oriented Pain management: satisfactory to patient Vital Signs Assessment: post-procedure vital signs reviewed and stable Respiratory status: spontaneous breathing and nonlabored ventilation Cardiovascular status: stable Postop Assessment: no headache, no backache, no signs of nausea or vomiting, adequate PO intake and patient able to bend at knees (patient up walking) Anesthetic complications: no    Last Vitals:  Vitals:   10/12/17 1310 10/12/17 1410  BP: (!) 104/57 101/60  Pulse: 60 68  Resp: 18 18  Temp: 36.5 C 36.9 C  SpO2:      Last Pain:  Vitals:   10/12/17 1410  TempSrc: Oral  PainSc: 0-No pain   Pain Goal:                 Shellye Zandi

## 2017-10-12 NOTE — Anesthesia Preprocedure Evaluation (Signed)

## 2017-10-12 NOTE — Anesthesia Pain Management Evaluation Note (Signed)
  [facility] Pain Management Visit Note  Patient: Mackenzie Lambert, 22 y.o., female  "Hello I am a member of the anesthesia team at [facility]. We have an anesthesia team available at all times to provide care throughout the hospital, including epidural management and anesthesia for C-section. I don't know your plan for the delivery whether it a natural birth, water birth, IV sedation, nitrous supplementation, doula or epidural, but we want to meet your pain goals."   1.Was your pain managed to your expectations on prior hospitalizations?   No prior hospitalizations  2.What is your expectation for pain management during this hospitalization?     Labor support without medications, Epidural and IV pain meds  3.How can we help you reach that goal? Not sure what she wants to do yet but open to IV pain meds and epidural  Record the patient's initial score and the patient's pain goal.   Pain: 2  Pain Goal: 6 [facility] wants you to be able to say your pain was always managed very well.  Mackenzie Lambert 10/12/2017

## 2017-10-12 NOTE — H&P (Signed)
OBSTETRIC ADMISSION HISTORY AND PHYSICAL  Mackenzie Lambert is a 22 y.o. female G67P0110 with IUP at [redacted]w[redacted]d by LMP + 5w [place] presenting for SROM around 0315 this morning. She reports +FMs, no VB, no blurry vision, headaches or peripheral edema, and RUQ pain.  She plans on breast feeding. She request OCPs for birth control. She received her prenatal care at [facility] and [facility]  Dating: By LMP and 5w [place] --->  Estimated Date of Delivery: 11/01/17  Sono:   @[redacted]w[redacted]d , CWD, normal anatomy, cephalic presentation, anterior placenta, above cervical os, 645g, 56% EFW  Prenatal History/Complications: PNC at [facility] (transferred from [facility] Hx of perinatal loss at [redacted] weeks GA  Past Medical History: Past Medical History:  Diagnosis Date  . Medical history non-contributory   . PONV (postoperative nausea and vomiting)     Past Surgical History: Past Surgical History:  Procedure Laterality Date  . NO PAST SURGERIES      Obstetrical History: OB History    Gravida Para Term Preterm AB Living   3 1   1 1  0   SAB TAB Ectopic Multiple Live Births   1       0      Social History: Social History   Socioeconomic History  . Marital status: Single    Spouse name: None  . Number of children: None  . Years of education: None  . Highest education level: None  Social Needs  . Financial resource strain: None  . Food insecurity - worry: None  . Food insecurity - inability: None  . Transportation needs - medical: None  . Transportation needs - non-medical: None  Occupational History  . None  Tobacco Use  . Smoking status: Never Smoker  . Smokeless tobacco: Never Used  Substance and Sexual Activity  . Alcohol use: No  . Drug use: No  . Sexual activity: Yes    Birth control/protection: None  Other Topics Concern  . None  Social History Narrative  . None    Family History: Family History  Problem Relation Age of Onset  . Hypertension Maternal Grandmother   . Hypertension Maternal Grandfather      Allergies: No Known Allergies  Facility-Administered Medications Prior to Admission  Medication Dose Route Frequency Provider Last Rate Last Dose  . HYDROXYprogesterone Caproate SOAJ 275 mg  275 mg Subcutaneous Weekly Conan Bowens, MD   275 mg at 10/08/17 1411   Medications Prior to Admission  Medication Sig Dispense Refill Last Dose  . acetaminophen (TYLENOL) 500 MG tablet Take 500 mg by mouth every 6 (six) hours as needed for mild pain or moderate pain.   Past Month at Unknown time  . Prenatal Vit-Fe Phos-FA-Omega (VITAFOL GUMMIES) 3.33-0.333-34.8 MG CHEW Chew 1 tablet by mouth daily. 90 tablet 5 10/11/2017 at Unknown time  . docusate sodium (COLACE) 100 MG capsule Take 1 capsule (100 mg total) by mouth 2 (two) times daily as needed. (Patient not taking: Reported on 10/08/2017) 30 capsule 2 Not Taking  . Misc. Devices (BREAST PUMP) MISC Dispense one breast pump for patient 1 each 0      Review of Systems   All systems reviewed and negative except as stated in HPI  Blood pressure 106/70, pulse 80, temperature 98.2 F (36.8 C), temperature source Oral, resp. rate 18, height 5\' 1"  (1.549 m), weight 146 lb (66.2 kg), last menstrual period 01/23/2017, SpO2 100 %. General appearance: alert and cooperative Lungs: clear to auscultation bilaterally Heart: regular rate and rhythm  Abdomen: soft, non-tender; bowel sounds normal Extremities: Homans sign is negative, no sign of DVT Presentation: cephalic Fetal monitoring Baseline: 145 bpm, Variability: Good {> 6 bpm), Accelerations: Reactive and Decelerations: Absent Uterine activityFrequency: Every 3-4 minutes Dilation: 2 Effacement (%): 80 Station: -2 Exam by:: B Mosca RN   Prenatal labs: ABO, Rh: --/--/B POS (02/11 0347) Antibody: NEG (02/11 0347) Rubella: Immune (08/06 0000) RPR: Non Reactive (12/13 0806)  HBsAg: Negative (08/06 0000)  HIV: Non Reactive (12/13 0806)  GBS: Negative (02/07 1517)  2 hr GTT WNL Genetic screening   NT WNL, negative AFP Anatomy US: Normal  Prenatal Transfer Tool  Maternal Diabetes: No Genetic Screening: Normal Maternal Ultrasounds/Referrals: Normal Fetal Ultrasounds or other Referrals:  None Maternal Substance Abuse:  No Significant Maternal Medications:  None Significant Maternal Lab Results: Lab values include: Other: Anemic at 10.8  Results for orders placed or performed during the hospital encounter of 10/12/17 (from the past 24 hour(s))  POCT fern test   Collection Time: 10/12/17  3:05 AM  Result Value Ref Range   POCT Fern Test Positive = ruptured amniotic membanes   CBC   Collection Time: 10/12/17  3:47 AM  Result Value Ref Range   WBC 7.6 4.0 - 10.5 K/uL   RBC 4.01 3.87 - 5.11 MIL/uL   Hemoglobin 11.2 (L) 12.0 - 15.0 g/dL   HCT 82.933.9 (L) 56.236.0 - 13.046.0 %   MCV 84.5 78.0 - 100.0 fL   MCH 27.9 26.0 - 34.0 pg   MCHC 33.0 30.0 - 36.0 g/dL   RDW 86.514.7 78.411.5 - 69.615.5 %   Platelets 213 150 - 400 K/uL  Type and screen [facility]   Collection Time: 10/12/17  3:47 AM  Result Value Ref Range   ABO/RH(D) B POS    Antibody Screen NEG    Sample Expiration      10/15/2017 Performed at [facility], [address] [place], [place] [address]     Patient Active Problem List   Diagnosis Date Noted  . Normal labor 10/12/2017  . Supervision of high risk pregnancy, antepartum 04/22/2017  . Prior perinatal loss in second trimester, antepartum 04/22/2017    Assessment/Plan:  Mackenzie Lambert is a 22 y.o. G3P0110 at 257w1d here for evaluation for labor following SROM.  #Labor: Expectant management for now. Will augment with Pitocin if not making cervical change #Pain: Per pt request; not planning on epidural at this time #FWB: Cat I #ID:  GBS neg #MOF: breast #MOC:OCPs  #Circ:  Outpatient   Frederik PearJulie P Candis Kabel, MD  10/12/2017, 5:19 AM

## 2017-10-13 NOTE — Progress Notes (Signed)
POSTPARTUM PROGRESS NOTE  Post Partum Day #1  Subjective: Mackenzie Lambert is a 22 y.o. [id number] s/p SVD at 1060w1d.  No acute events overnight.  Pt denies problems with ambulating, voiding or po intake.  She denies nausea or vomiting.  Pain is well controlled.  She has had flatus. She has not had bowel movement.  Lochia Minimal.   Objective: Blood pressure (!) 101/51, pulse 64, temperature 97.7 F (36.5 C), temperature source Oral, resp. rate 18, height 5\' 1"  (1.549 m), weight 66.2 kg (146 lb), last menstrual period 01/23/2017, SpO2 100 %, unknown if currently breastfeeding.  Physical Exam:  General: Alert, cooperative and no distress Skin: Warm, and dry Heart: Regular rate and rhythm, distal pulses intact Lungs: No respiratory distress, CTAB without wheezing or rales. Abdomen: soft, nontender Uterine Fundus: firm, appropriately tender DVT Evaluation: No calf swelling or tenderness Extremities: trace BL edema  Recent Labs    10/12/17 0347  HGB 11.2*  HCT 33.9*    Assessment/Plan: Mackenzie MuttersSharece D Lambert is a 22 y.o. [id number] s/p SVD at 7560w1d   PPD#1 - Doing well  Contraception: POPs Feeding: breast Circ: outpatient Dispo: Plan for discharge 10/14/17.   LOS: 1 day   Mackenzie Lambert Mackenzie Lambert 10/13/2017, 6:48 AM

## 2017-10-13 NOTE — Lactation Note (Signed)
This note was copied from a baby's chart. Lactation Consultation Note  Patient Name: Mackenzie Ladoris GeneSharece Glaeser RUEAV'WToday's Date: 10/13/2017 Reason for consult: Follow-up assessment;Difficult latch;Infant < 6lbs;Early term 37-38.6wks Baby last ate 3.5 hours ago.  He is currently sleeping and swaddled in two blankets.  Unwrapped from blankets and placed to breast.  16 mm nipple shield applied.  Baby showing no interest in latching.  Mom gave baby 5 mls she had pumped earlier.  Baby did well with bottle.  Assisted mom in pumping with symphony pump.  Instructed to give 15 mls total of expressed milk and or formula if needed for volume every 3 hours.  Encouraged to call for assist/concerrns.  Maternal Data    Feeding Feeding Type: Breast Fed  LATCH Score Latch: Too sleepy or reluctant, no latch achieved, no sucking elicited.  Audible Swallowing: None  Type of Nipple: Flat  Comfort (Breast/Nipple): Soft / non-tender  Hold (Positioning): Assistance needed to correctly position infant at breast and maintain latch.  LATCH Score: 4  Interventions    Lactation Tools Discussed/Used Tools: Nipple Shields Nipple shield size: 16   Consult Status Consult Status: Follow-up Date: 10/14/17 Follow-up type: In-patient    Huston FoleyMOULDEN, Cortlan Dolin S 10/13/2017, 2:58 PM

## 2017-10-14 MED ORDER — IBUPROFEN 600 MG PO TABS: 600.0000 mg | ORAL_TABLET | Freq: Four times a day (QID) | ORAL | 0 refills | Status: DC | PRN

## 2017-10-14 NOTE — Discharge Instructions (Signed)
Postpartum Care After Vaginal Delivery °The period of time right after you deliver your newborn is called the postpartum period. °What kind of medical care will I receive? °· You may continue to receive fluids and medicines through an IV tube inserted into one of your veins. °· If an incision was made near your vagina (episiotomy) or if you had some vaginal tearing during delivery, cold compresses may be placed on your episiotomy or your tear. This helps to reduce pain and swelling. °· You may be given a squirt bottle to use when you go to the bathroom. You may use this until you are comfortable wiping as usual. To use the squirt bottle, follow these steps: °? Before you urinate, fill the squirt bottle with warm water. Do not use hot water. °? After you urinate, while you are sitting on the toilet, use the squirt bottle to rinse the area around your urethra and vaginal opening. This rinses away any urine and blood. °? You may do this instead of wiping. As you start healing, you may use the squirt bottle before wiping yourself. Make sure to wipe gently. °? Fill the squirt bottle with clean water every time you use the bathroom. °· You will be given sanitary pads to wear. °How can I expect to feel? °· You may not feel the need to urinate for several hours after delivery. °· You will have some soreness and pain in your abdomen and vagina. °· If you are breastfeeding, you may have uterine contractions every time you breastfeed for up to several weeks postpartum. Uterine contractions help your uterus return to its normal size. °· It is normal to have vaginal bleeding (lochia) after delivery. The amount and appearance of lochia is often similar to a menstrual period in the first week after delivery. It will gradually decrease over the next few weeks to a dry, yellow-brown discharge. For most women, lochia stops completely by 6-8 weeks after delivery. Vaginal bleeding can vary from woman to woman. °· Within the first few  days after delivery, you may have breast engorgement. This is when your breasts feel heavy, full, and uncomfortable. Your breasts may also throb and feel hard, tightly stretched, warm, and tender. After this occurs, you may have milk leaking from your breasts. Your health care provider can help you relieve discomfort due to breast engorgement. Breast engorgement should go away within a few days. °· You may feel more sad or worried than normal due to hormonal changes after delivery. These feelings should not last more than a few days. If these feelings do not go away after several days, speak with your health care provider. °How should I care for myself? °· Tell your health care provider if you have pain or discomfort. °· Drink enough water to keep your urine clear or pale yellow. °· Wash your hands thoroughly with soap and water for at least 20 seconds after changing your sanitary pads, after using the toilet, and before holding or feeding your baby. °· If you are not breastfeeding, avoid touching your breasts a lot. Doing this can make your breasts produce more milk. °· If you become weak or lightheaded, or you feel like you might faint, ask for help before: °? Getting out of bed. °? Showering. °· Change your sanitary pads frequently. Watch for any changes in your flow, such as a sudden increase in volume, a change in color, the passing of large blood clots. If you pass a blood clot from your vagina, save it   to show to your health care provider. Do not flush blood clots down the toilet without having your health care provider look at them. °· Make sure that all your vaccinations are up to date. This can help protect you and your baby from getting certain diseases. You may need to have immunizations done before you leave the hospital. °· If desired, talk with your health care provider about methods of family planning or birth control (contraception). °How can I start bonding with my baby? °Spending as much time as  possible with your baby is very important. During this time, you and your baby can get to know each other and develop a bond. Having your baby stay with you in your room (rooming in) can give you time to get to know your baby. Rooming in can also help you become comfortable caring for your baby. Breastfeeding can also help you bond with your baby. °How can I plan for returning home with my baby? °· Make sure that you have a car seat installed in your vehicle. °? Your car seat should be checked by a certified car seat installer to make sure that it is installed safely. °? Make sure that your baby fits into the car seat safely. °· Ask your health care provider any questions you have about caring for yourself or your baby. Make sure that you are able to contact your health care provider with any questions after leaving the hospital. °This information is not intended to replace advice given to you by your health care provider. Make sure you discuss any questions you have with your health care provider. °Document Released: 06/15/2007 Document Revised: 01/21/2016 Document Reviewed: 07/23/2015 °Elsevier Interactive Patient Education © 2018 [employer] ° ° ° °Breastfeeding °Choosing to breastfeed is one of the best decisions you can make for yourself and your baby. A change in hormones during pregnancy causes your breasts to make breast milk in your milk-producing glands. Hormones prevent breast milk from being released before your baby is born. They also prompt milk flow after birth. Once breastfeeding has begun, thoughts of your baby, as well as his or her sucking or crying, can stimulate the release of milk from your milk-producing glands. °Benefits of breastfeeding °Research shows that breastfeeding offers many health benefits for infants and mothers. It also offers a cost-free and convenient way to feed your baby. °For your baby °· Your first milk (colostrum) helps your baby's digestive system to function  better. °· Special cells in your milk (antibodies) help your baby to fight off infections. °· Breastfed babies are less likely to develop asthma, allergies, obesity, or type 2 diabetes. They are also at lower risk for sudden infant death syndrome (SIDS). °· Nutrients in breast milk are better able to meet your baby’s needs compared to infant formula. °· Breast milk improves your baby's brain development. °For you °· Breastfeeding helps to create a very special bond between you and your baby. °· Breastfeeding is convenient. Breast milk costs nothing and is always available at the correct temperature. °· Breastfeeding helps to burn calories. It helps you to lose the weight that you gained during pregnancy. °· Breastfeeding makes your uterus return faster to its size before pregnancy. It also slows bleeding (lochia) after you give birth. °· Breastfeeding helps to lower your risk of developing type 2 diabetes, osteoporosis, rheumatoid arthritis, cardiovascular disease, and breast, ovarian, uterine, and endometrial cancer later in life. °Breastfeeding basics °Starting breastfeeding °· Find a comfortable place to sit or lie   down, with your neck and back well-supported. °· Place a pillow or a rolled-up blanket under your baby to bring him or her to the level of your breast (if you are seated). Nursing pillows are specially designed to help support your arms and your baby while you breastfeed. °· Make sure that your baby's tummy (abdomen) is facing your abdomen. °· Gently massage your breast. With your fingertips, massage from the outer edges of your breast inward toward the nipple. This encourages milk flow. If your milk flows slowly, you may need to continue this action during the feeding. °· Support your breast with 4 fingers underneath and your thumb above your nipple (make the letter "C" with your hand). Make sure your fingers are well away from your nipple and your baby’s mouth. °· Stroke your baby's lips gently with  your finger or nipple. °· When your baby's mouth is open wide enough, quickly bring your baby to your breast, placing your entire nipple and as much of the areola as possible into your baby's mouth. The areola is the colored area around your nipple. °? More areola should be visible above your baby's upper lip than below the lower lip. °? Your baby's lips should be opened and extended outward (flanged) to ensure an adequate, comfortable latch. °? Your baby's tongue should be between his or her lower gum and your breast. °· Make sure that your baby's mouth is correctly positioned around your nipple (latched). Your baby's lips should create a seal on your breast and be turned out (everted). °· It is common for your baby to suck about 2-3 minutes in order to start the flow of breast milk. °Latching °Teaching your baby how to latch onto your breast properly is very important. An improper latch can cause nipple pain, decreased milk supply, and poor weight gain in your baby. Also, if your baby is not latched onto your nipple properly, he or she may swallow some air during feeding. This can make your baby fussy. Burping your baby when you switch breasts during the feeding can help to get rid of the air. However, teaching your baby to latch on properly is still the best way to prevent fussiness from swallowing air while breastfeeding. °Signs that your baby has successfully latched onto your nipple °· Silent tugging or silent sucking, without causing you pain. Infant's lips should be extended outward (flanged). °· Swallowing heard between every 3-4 sucks once your milk has started to flow (after your let-down milk reflex occurs). °· Muscle movement above and in front of his or her ears while sucking. ° °Signs that your baby has not successfully latched onto your nipple °· Sucking sounds or smacking sounds from your baby while breastfeeding. °· Nipple pain. ° °If you think your baby has not latched on correctly, slip your  finger into the corner of your baby’s mouth to break the suction and place it between your baby's gums. Attempt to start breastfeeding again. °Signs of successful breastfeeding °Signs from your baby °· Your baby will gradually decrease the number of sucks or will completely stop sucking. °· Your baby will fall asleep. °· Your baby's body will relax. °· Your baby will retain a small amount of milk in his or her mouth. °· Your baby will let go of your breast by himself or herself. ° °Signs from you °· Breasts that have increased in firmness, weight, and size 1-3 hours after feeding. °· Breasts that are softer immediately after breastfeeding. °· Increased milk volume,   as well as a change in milk consistency and color by the fifth day of breastfeeding. °· Nipples that are not sore, cracked, or bleeding. ° °Signs that your baby is getting enough milk °· Wetting at least 1-2 diapers during the first 24 hours after birth. °· Wetting at least 5-6 diapers every 24 hours for the first week after birth. The urine should be clear or pale yellow by the age of 5 days. °· Wetting 6-8 diapers every 24 hours as your baby continues to grow and develop. °· At least 3 stools in a 24-hour period by the age of 5 days. The stool should be soft and yellow. °· At least 3 stools in a 24-hour period by the age of 7 days. The stool should be seedy and yellow. °· No loss of weight greater than 10% of birth weight during the first 3 days of life. °· Average weight gain of 4-7 oz (113-198 g) per week after the age of 4 days. °· Consistent daily weight gain by the age of 5 days, without weight loss after the age of 2 weeks. °After a feeding, your baby may spit up a small amount of milk. This is normal. °Breastfeeding frequency and duration °Frequent feeding will help you make more milk and can prevent sore nipples and extremely full breasts (breast engorgement). Breastfeed when you feel the need to reduce the fullness of your breasts or when your  baby shows signs of hunger. This is called "breastfeeding on demand." Signs that your baby is hungry include: °· Increased alertness, activity, or restlessness. °· Movement of the head from side to side. °· Opening of the mouth when the corner of the mouth or cheek is stroked (rooting). °· Increased sucking sounds, smacking lips, cooing, sighing, or squeaking. °· Hand-to-mouth movements and sucking on fingers or hands. °· Fussing or crying. ° °Avoid introducing a pacifier to your baby in the first 4-6 weeks after your baby is born. After this time, you may choose to use a pacifier. Research has shown that pacifier use during the first year of a baby's life decreases the risk of sudden infant death syndrome (SIDS). °Allow your baby to feed on each breast as long as he or she wants. When your baby unlatches or falls asleep while feeding from the first breast, offer the second breast. Because newborns are often sleepy in the first few weeks of life, you may need to awaken your baby to get him or her to feed. °Breastfeeding times will vary from baby to baby. However, the following rules can serve as a guide to help you make sure that your baby is properly fed: °· Newborns (babies 4 weeks of age or younger) may breastfeed every 1-3 hours. °· Newborns should not go without breastfeeding for longer than 3 hours during the day or 5 hours during the night. °· You should breastfeed your baby a minimum of 8 times in a 24-hour period. ° °Breast milk pumping °Pumping and storing breast milk allows you to make sure that your baby is exclusively fed your breast milk, even at times when you are unable to breastfeed. This is especially important if you go back to work while you are still breastfeeding, or if you are not able to be present during feedings. Your lactation consultant can help you find a method of pumping that works best for you and give you guidelines about how long it is safe to store breast milk. °Caring for your  breasts while you   breastfeed °Nipples can become dry, cracked, and sore while breastfeeding. The following recommendations can help keep your breasts moisturized and healthy: °· Avoid using soap on your nipples. °· Wear a supportive bra designed especially for nursing. Avoid wearing underwire-style bras or extremely tight bras (sports bras). °· Air-dry your nipples for 3-4 minutes after each feeding. °· Use only cotton bra pads to absorb leaked breast milk. Leaking of breast milk between feedings is normal. °· Use lanolin on your nipples after breastfeeding. Lanolin helps to maintain your skin's normal moisture barrier. Pure lanolin is not harmful (not toxic) to your baby. You may also hand express a few drops of breast milk and gently massage that milk into your nipples and allow the milk to air-dry. ° °In the first few weeks after giving birth, some women experience breast engorgement. Engorgement can make your breasts feel heavy, warm, and tender to the touch. Engorgement peaks within 3-5 days after you give birth. The following recommendations can help to ease engorgement: °· Completely empty your breasts while breastfeeding or pumping. You may want to start by applying warm, moist heat (in the shower or with warm, water-soaked hand towels) just before feeding or pumping. This increases circulation and helps the milk flow. If your baby does not completely empty your breasts while breastfeeding, pump any extra milk after he or she is finished. °· Apply ice packs to your breasts immediately after breastfeeding or pumping, unless this is too uncomfortable for you. To do this: °? Put ice in a plastic bag. °? Place a towel between your skin and the bag. °? Leave the ice on for 20 minutes, 2-3 times a day. °· Make sure that your baby is latched on and positioned properly while breastfeeding. ° °If engorgement persists after 48 hours of following these recommendations, contact your health care provider or a lactation  consultant. °Overall health care recommendations while breastfeeding °· Eat 3 healthy meals and 3 snacks every day. Well-nourished mothers who are breastfeeding need an additional 450-500 calories a day. You can meet this requirement by increasing the amount of a balanced diet that you eat. °· Drink enough water to keep your urine pale yellow or clear. °· Rest often, relax, and continue to take your prenatal vitamins to prevent fatigue, stress, and low vitamin and mineral levels in your body (nutrient deficiencies). °· Do not use any products that contain nicotine or tobacco, such as cigarettes and e-cigarettes. Your baby may be harmed by chemicals from cigarettes that pass into breast milk and exposure to secondhand smoke. If you need help quitting, ask your health care provider. °· Avoid alcohol. °· Do not use illegal drugs or marijuana. °· Talk with your health care provider before taking any medicines. These include over-the-counter and prescription medicines as well as vitamins and herbal supplements. Some medicines that may be harmful to your baby can pass through breast milk. °· It is possible to become pregnant while breastfeeding. If birth control is desired, ask your health care provider about options that will be safe while breastfeeding your baby. °Where to find more information: °[employer]: www.llli.org °Contact a health care provider if: °· You feel like you want to stop breastfeeding or have become frustrated with breastfeeding. °· Your nipples are cracked or bleeding. °· Your breasts are red, tender, or warm. °· You have: °? Painful breasts or nipples. °? A swollen area on either breast. °? A fever or chills. °? Nausea or vomiting. °? Drainage other than breast   milk from your nipples. °· Your breasts do not become full before feedings by the fifth day after you give birth. °· You feel sad and depressed. °· Your baby is: °? Too sleepy to eat well. °? Having trouble  sleeping. °? More than 1 week old and wetting fewer than 6 diapers in a 24-hour period. °? Not gaining weight by 5 days of age. °· Your baby has fewer than 3 stools in a 24-hour period. °· Your baby's skin or the white parts of his or her eyes become yellow. °Get help right away if: °· Your baby is overly tired (lethargic) and does not want to wake up and feed. °· Your baby develops an unexplained fever. °Summary °· Breastfeeding offers many health benefits for infant and mothers. °· Try to breastfeed your infant when he or she shows early signs of hunger. °· Gently tickle or stroke your baby's lips with your finger or nipple to allow the baby to open his or her mouth. Bring the baby to your breast. Make sure that much of the areola is in your baby's mouth. Offer one side and burp the baby before you offer the other side. °· Talk with your health care provider or lactation consultant if you have questions or you face problems as you breastfeed. °This information is not intended to replace advice given to you by your health care provider. Make sure you discuss any questions you have with your health care provider. °Document Released: 08/18/2005 Document Revised: 09/19/2016 Document Reviewed: 09/19/2016 °Elsevier Interactive Patient Education © 2018 [employer]. ° °

## 2017-10-14 NOTE — Lactation Note (Signed)
This note was copied from a baby's chart. Lactation Consultation Note  Patient Name: Mackenzie Lambert  Milk is in and mom pumping 30+ mls every 2-3 hours.  Mom has a Medela Pump In Style at home.  Instructed to pump 8-12 times in 24 hours.  Encouraged to attempt to latch baby some.  Lactation outpatient services and support reviewed and encouraged prn.   Maternal Data    Feeding Feeding Type: Breast Milk Nipple Type: Slow - flow  LATCH Score Latch: (Mom said she is no longer latching. )                 Interventions    Lactation Tools Discussed/Used     Consult Status      Huston FoleyMOULDEN, [place]iloh [place] S Lambert, 11:42 AM

## 2017-10-14 NOTE — Discharge Summary (Signed)
OB Discharge Summary     Patient Name: Mackenzie Lambert DOB: 1995/11/05 MRN: [id number]  Date of admission: 10/12/2017 Delivering MD: Catalina AntiguaONSTANT, PEGGY   Date of discharge: 10/14/2017  Admitting diagnosis: 37 WEEKS POSSIBLE ROM Intrauterine pregnancy: 5451w1d     Secondary diagnosis:  Active Problems:   Normal labor  Additional problems: none     Discharge diagnosis: Term Pregnancy Delivered                                                                                                Post partum procedures:none  Augmentation: none  Complications: None  Hospital course:  Onset of Labor With Vaginal Delivery     22 y.o. yo [id number] at 2051w1d was admitted in Latent Labor on 10/12/2017. Patient had an uncomplicated labor course as follows:  Membrane Rupture Time/Date: 2:30 AM ,10/12/2017   Intrapartum Procedures: Episiotomy: None [1]                                         Lacerations:  1st degree [2]  Patient had a delivery of a Viable infant. 10/12/2017  Information for the patient's newborn:  Christy SartoriusRobinson, Boy Pauletta [[id number]][[id number]]  Delivery Method: Vaginal, Spontaneous(Filed from Delivery Summary)    Pateint had an uncomplicated postpartum course.  She is ambulating, tolerating a regular diet, passing flatus, and urinating well. Patient is discharged home in stable condition on 10/14/17.   Physical exam  Vitals:   10/12/17 1837 10/13/17 0500 10/13/17 1804 10/14/17 0525  BP: (!) 101/52 (!) 101/51 109/67 (!) 96/58  Pulse: 88 64 70 70  Resp: 18  18 18   Temp: 98.6 F (37 C) 97.7 F (36.5 C) 98.1 F (36.7 C) 98.6 F (37 C)  TempSrc: Oral Oral Oral Oral  SpO2: 100%   100%  Weight:      Height:       General: alert, cooperative and no distress Lochia: appropriate Uterine Fundus: firm Incision: N/A DVT Evaluation: No evidence of DVT seen on physical exam. Negative Homan's sign. No cords or calf tenderness. No significant calf/ankle edema. Labs: Lab Results  Component  Value Date   WBC 7.6 10/12/2017   HGB 11.2 (L) 10/12/2017   HCT 33.9 (L) 10/12/2017   MCV 84.5 10/12/2017   PLT 213 10/12/2017   No flowsheet data found.  Discharge instruction: per After Visit Summary and "Baby and Me Booklet".  After visit meds:  Allergies as of 10/14/2017   No Known Allergies     Medication List    STOP taking these medications   acetaminophen 500 MG tablet Commonly known as:  TYLENOL   docusate sodium 100 MG capsule Commonly known as:  COLACE     TAKE these medications   Breast Pump Misc Dispense one breast pump for patient   ibuprofen 600 MG tablet Commonly known as:  ADVIL,MOTRIN Take 1 tablet (600 mg total) by mouth every 6 (six) hours as needed for mild pain, moderate pain or cramping.   VITAFOL GUMMIES  3.33-0.333-34.8 MG Chew Chew 1 tablet by mouth daily.       Diet: routine diet  Activity: Advance as tolerated. Pelvic rest for 6 weeks.   Outpatient follow up:4-6wks Follow up Appt:No future appointments. Follow up Visit:No Follow-up on file.  Postpartum contraception: abstinence until POPs  Newborn Data: Live born female  Birth Weight: 5 lb 9.8 oz (2546 g) APGAR: 8, 8  Newborn Delivery   Birth date/time:  10/12/2017 11:29:00 Delivery type:  Vaginal, Spontaneous     Baby Feeding: breast & bottle Disposition:home with mother   10/14/2017 Cheral Marker, CNM

## 2017-10-15 ENCOUNTER — Encounter: Payer: Medicaid Other | Admitting: Obstetrics and Gynecology

## 2018-01-31 ENCOUNTER — Emergency Department (HOSPITAL_COMMUNITY)
Admission: EM | Admit: 2018-01-31 | Discharge: 2018-01-31 | Disposition: A | Discharge status: Home / Self Care | Payer: Medicaid Other | Attending: Emergency Medicine | Admitting: Emergency Medicine

## 2018-01-31 ENCOUNTER — Emergency Department (HOSPITAL_COMMUNITY): Payer: Medicaid Other

## 2018-01-31 ENCOUNTER — Encounter (HOSPITAL_COMMUNITY): Payer: Self-pay | Admitting: Emergency Medicine

## 2018-01-31 DIAGNOSIS — Y939 Activity, unspecified: Secondary | ICD-10-CM | POA: Diagnosis not present

## 2018-01-31 DIAGNOSIS — W230XXA Caught, crushed, jammed, or pinched between moving objects, initial encounter: Secondary | ICD-10-CM | POA: Insufficient documentation

## 2018-01-31 DIAGNOSIS — Y929 Unspecified place or not applicable: Secondary | ICD-10-CM | POA: Diagnosis not present

## 2018-01-31 DIAGNOSIS — Z23 Encounter for immunization: Secondary | ICD-10-CM | POA: Diagnosis not present

## 2018-01-31 DIAGNOSIS — S61210A Laceration without foreign body of right index finger without damage to nail, initial encounter: Secondary | ICD-10-CM

## 2018-01-31 DIAGNOSIS — Z79899 Other long term (current) drug therapy: Secondary | ICD-10-CM | POA: Insufficient documentation

## 2018-01-31 DIAGNOSIS — Y998 Other external cause status: Secondary | ICD-10-CM | POA: Insufficient documentation

## 2018-01-31 MED ORDER — LIDOCAINE HCL (PF) 1 % IJ SOLN
10.0000 mL | Freq: Once | INTRAMUSCULAR | Status: AC
  Administered 2018-01-31: 10 mL
  Filled 2018-01-31: qty 10

## 2018-01-31 MED ORDER — IBUPROFEN 400 MG PO TABS
600.0000 mg | ORAL_TABLET | Freq: Once | ORAL | Status: AC
  Administered 2018-01-31: 12:00:00 600 mg via ORAL
  Filled 2018-01-31: qty 1

## 2018-01-31 MED ORDER — TETANUS-DIPHTH-ACELL PERTUSSIS 5-2.5-18.5 LF-MCG/0.5 IM SUSP
0.5000 mL | Freq: Once | INTRAMUSCULAR | Status: AC
  Administered 2018-01-31: 0.5 mL via INTRAMUSCULAR
  Filled 2018-01-31: qty 0.5

## 2018-01-31 NOTE — ED Notes (Signed)
Patient is alert and orientedx4.  Patient was explained discharge instructions and they understood them with no questions.   

## 2018-01-31 NOTE — Discharge Instructions (Signed)
You were seen in the ER for an injury to your right hand.  The x-ray did not show fractures or dislocations of any bones.  4 stitches were placed into the laceration in your second finger.  Keep this area dry for the next 24 hours.  After 24 hours you may get this area wet but do not soak it.  We have given you a splint to help minimize movement of the finger to keep the stitches intact.  We have updated your tetanus shot. Please take ibuprofen 600 mg every 8 hours as needed for pain. You will need to return to the emergency department, go to an urgent care, or see your primary care provider in 7 days for recheck of this area and stitches removal.  Return to the ER sooner for new or worsening symptoms including but not limited to fever, chills, drainage from the wound, redness around the wound, or any other concerns.

## 2018-01-31 NOTE — ED Triage Notes (Signed)
Patient complains of pain to right index and middle finger after closing the fingers in a car door. Laceration to right index finger approximately 2 cm in length around distal portion of finger, bleeding controlled. Skin intact on middle finger. Patient states she is unable to move distal portion of index finger.

## 2018-01-31 NOTE — ED Provider Notes (Signed)
[facility] EMERGENCY DEPARTMENT Provider Note   CSN: [id number] Arrival date & time: 01/31/18  1191     History   Chief Complaint Chief Complaint  Patient presents with  . Finger Injury    HPI Mackenzie Lambert is a 22 y.o. female without significant past medical hx who presents to the ED s/p R hand injury that occurred at 0900 this AM. Patient states she accidentally slammed her 2nd and 3rd digits in a car door. She states she has pain to the middle/distal aspect of these digits. Rates pain a 10/10 in severity, worse with movement. She has a laceration to the 2nd digit. She has paresthesias to distal aspects of the 2nd and 3rd digit. No other areas of injury. Unknown last tetanus. Patient is R hand dominant.   HPI  Past Medical History:  Diagnosis Date  . Medical history non-contributory   . PONV (postoperative nausea and vomiting)     Patient Active Problem List   Diagnosis Date Noted  . Normal labor 10/12/2017  . Supervision of high risk pregnancy, antepartum 04/22/2017  . Prior perinatal loss in second trimester, antepartum 04/22/2017    Past Surgical History:  Procedure Laterality Date  . NO PAST SURGERIES       OB History    Gravida  3   Para  2   Term  1   Preterm  1   AB  1   Living  1     SAB  1   TAB      Ectopic      Multiple  0   Live Births  1            Home Medications    Prior to Admission medications   Medication Sig Start Date End Date Taking? Authorizing Provider  ibuprofen (ADVIL,MOTRIN) 600 MG tablet Take 1 tablet (600 mg total) by mouth every 6 (six) hours as needed for mild pain, moderate pain or cramping. 10/14/17   Cheral Marker, CNM  Misc. Devices (BREAST PUMP) MISC Dispense one breast pump for patient 09/23/17   Katrinka Blazing, [place], CNM  Prenatal Vit-Fe Phos-FA-Omega (VITAFOL GUMMIES) 3.33-0.333-34.8 MG CHEW Chew 1 tablet by mouth daily. 08/13/17   Conan Bowens, MD    Family History Family  History  Problem Relation Age of Onset  . Hypertension Maternal Grandmother   . Hypertension Maternal Grandfather     Social History Social History   Tobacco Use  . Smoking status: Never Smoker  . Smokeless tobacco: Never Used  Substance Use Topics  . Alcohol use: No  . Drug use: No     Allergies   Patient has no known allergies.   Review of Systems Review of Systems  Constitutional: Negative for chills and fever.  Musculoskeletal:       Positive for pain to R 2nd/3rd digits  Skin: Positive for wound.  Neurological: Negative for weakness.       Positive for paresthesias to distal R 2nd/3rd digits.      Physical Exam Updated Vital Signs BP 109/68 (BP Location: Left Arm)   Pulse 78   Temp 98 F (36.7 C) (Oral)   SpO2 100%   Physical Exam  Constitutional: She appears well-developed and well-nourished. No distress.  HENT:  Head: Normocephalic and atraumatic.  Eyes: Conjunctivae are normal. Right eye exhibits no discharge. Left eye exhibits no discharge.  Cardiovascular:  2+ symmetric radial pulses.  Musculoskeletal:  Other than right second digit laceration, no  obvious deformity, appreciable swelling, erythema, ecchymosis, or open wounds. Upper extremities: Patient has normal range of motion to bilateral elbows and wrists as well as all digits with the exception of the second and third digits at the PIP and DIP joints.  Patient is able to flex and extend in each of these areas somewhat, however she is not able to perform fully perform these ROMs. She is tender to palpation over the middle and distal phalanxes as well as the DIP joints in the R 2nd and 3rd digits. NVI distally.   Neurological: She is alert.  Clear speech. Sensation grossly intact to bilateral upper extremities. Patient able to perform OK sign, thumbs up, and cross 2nd/3rd digits bilaterally. Difficult to assess grip strength secondary to pain and ROM limitations.   Skin: Skin is warm and dry. Capillary  refill takes less than 2 seconds.  1.5 cm linear diagonal laceration to the palmar aspect of the R 2nd finger, this occurs in the area of the distal phalanx extending to just distal to the crease of the DIP joint. No appreciable foreign body. No active bleeding.   Psychiatric: She has a normal mood and affect. Her behavior is normal. Thought content normal.  Nursing note and vitals reviewed.    ED Treatments / Results  Labs (all labs ordered are listed, but only abnormal results are displayed) Labs Reviewed - No data to display  EKG None  Radiology Dg Hand Complete Right  Result Date: 01/31/2018 CLINICAL DATA:  Trauma to the second and third fingers. EXAM: RIGHT HAND - COMPLETE 3+ VIEW COMPARISON:  None. FINDINGS: Soft tissue swelling of the distal second and third fingers. No foreign bodies. No fractures identified. IMPRESSION: Negative. Electronically Signed   By: Gerome Samavid  Williams III M.D   On: 01/31/2018 10:29    Procedures .Marland Kitchen.Laceration Repair Date/Time: 01/31/2018 11:37 AM Performed by: Cherly AndersonPetrucelli, Hristopher Missildine R, PA-C Authorized by: Cherly AndersonPetrucelli, Camarion Weier R, PA-C   Consent:    Consent obtained:  Verbal   Consent given by:  Patient   Risks discussed:  Infection, need for additional repair, nerve damage, pain, poor cosmetic result, poor wound healing, retained foreign body, tendon damage and vascular damage   Alternatives discussed:  No treatment Anesthesia (see MAR for exact dosages):    Anesthesia method:  Nerve block   Block location:  R 2nd finger   Block needle gauge:  27 G   Block anesthetic:  Lidocaine 1% w/o epi   Block injection procedure:  Anatomic landmarks identified   Block outcome:  Anesthesia achieved Laceration details:    Location:  Finger   Finger location:  R index finger   Length (cm):  1.5 Repair type:    Repair type:  Simple Pre-procedure details:    Preparation:  Patient was prepped and draped in usual sterile fashion and imaging obtained to evaluate for  foreign bodies Exploration:    Hemostasis achieved with:  Direct pressure   Wound exploration: wound explored through full range of motion and entire depth of wound probed and visualized     Contaminated: no   Treatment:    Area cleansed with:  Betadine   Amount of cleaning:  Standard   Irrigation solution:  Sterile saline   Irrigation method:  Pressure wash Skin repair:    Repair method:  Sutures   Suture size:  4-0   Suture material:  Nylon   Number of sutures:  4 Approximation:    Approximation:  Close Post-procedure details:    Dressing:  Antibiotic ointment, non-adherent dressing and splint for protection   Patient tolerance of procedure:  Tolerated well, no immediate complications   (including critical care time)  Medications Ordered in ED Medications  ibuprofen (ADVIL,MOTRIN) tablet 600 mg (has no administration in time range)  Tdap (BOOSTRIX) injection 0.5 mL (0.5 mLs Intramuscular Given 01/31/18 1049)  lidocaine (PF) (XYLOCAINE) 1 % injection 10 mL (10 mLs Infiltration Given 01/31/18 1048)   Initial Impression / Assessment and Plan / ED Course  I have reviewed the triage vital signs and the nursing notes.  Pertinent labs & imaging results that were available during my care of the patient were reviewed by me and considered in my medical decision making (see chart for details).    Patient presents with injury to right second and third digit after slamming hand in car door. Patient nontoxic appearing, in no apparent distress, vitals WNL. X ray obtained and negative for fracture or dislocation. NVI distally in each of these digits. She sustained a laceration to the R 2nd digit. Pressure irrigation performed. Wound explored and base of wound visualized in a bloodless field without evidence of foreign body.  Digital block and laceration repair per procedure note above. After digital block patient with full ROM to the 2nd and 3rd digt DIP/PIP joints, suspect initial limitation was  pain related. Patient tolerated procedure well. Tetanus updated. She has no comorbidities to effect normal wound healing. Pt discharged without antibiotics.  Discussed suture home care with patient and answered questions. Patient to follow-up for wound check and suture removal in 7 days; they are to return to the ED sooner for signs of infection. I discussed results, treatment plan, need for follow-up, and return precautions with the patient. Provided opportunity for questions, patient confirmed understanding and is in agreement with plan.   Final Clinical Impressions(s) / ED Diagnoses   Final diagnoses:  Laceration of right index finger without foreign body without damage to nail, initial encounter    ED Discharge Orders    None       Cherly Anderson, PA-C 01/31/18 1432    Doug Sou, MD 01/31/18 [phone]

## 2018-02-07 ENCOUNTER — Encounter (HOSPITAL_COMMUNITY): Payer: Self-pay | Admitting: Emergency Medicine

## 2018-02-07 ENCOUNTER — Emergency Department (HOSPITAL_COMMUNITY)
Admission: EM | Admit: 2018-02-07 | Discharge: 2018-02-07 | Disposition: A | Discharge status: Home / Self Care | Payer: Medicaid Other | Attending: Emergency Medicine | Admitting: Emergency Medicine

## 2018-02-07 DIAGNOSIS — Z5189 Encounter for other specified aftercare: Secondary | ICD-10-CM | POA: Diagnosis not present

## 2018-02-07 DIAGNOSIS — S6991XD Unspecified injury of right wrist, hand and finger(s), subsequent encounter: Secondary | ICD-10-CM | POA: Diagnosis not present

## 2018-02-07 DIAGNOSIS — X58XXXD Exposure to other specified factors, subsequent encounter: Secondary | ICD-10-CM | POA: Insufficient documentation

## 2018-02-07 DIAGNOSIS — Z4802 Encounter for removal of sutures: Secondary | ICD-10-CM | POA: Diagnosis present

## 2018-02-07 NOTE — ED Triage Notes (Signed)
Pt here for suture removal to right pointer finger, has had them for 7 days.

## 2018-02-07 NOTE — Discharge Instructions (Addendum)
Come back in 4-5 days to have them removed

## 2018-02-07 NOTE — ED Provider Notes (Signed)
[facility] EMERGENCY DEPARTMENT Provider Note   CSN: [id number] Arrival date & time: 02/07/18  1146     History   Chief Complaint Chief Complaint  Patient presents with  . Suture / Staple Removal    HPI Mackenzie Lambert is a 22 y.o. female.  Pt comes in to have the sutures removed from her right index finger. Denies any problems.      Past Medical History:  Diagnosis Date  . Medical history non-contributory   . PONV (postoperative nausea and vomiting)     Patient Active Problem List   Diagnosis Date Noted  . Normal labor 10/12/2017  . Supervision of high risk pregnancy, antepartum 04/22/2017  . Prior perinatal loss in second trimester, antepartum 04/22/2017    Past Surgical History:  Procedure Laterality Date  . NO PAST SURGERIES       OB History    Gravida  3   Para  2   Term  1   Preterm  1   AB  1   Living  1     SAB  1   TAB      Ectopic      Multiple  0   Live Births  1            Home Medications    Prior to Admission medications   Medication Sig Start Date End Date Taking? Authorizing Provider  ibuprofen (ADVIL,MOTRIN) 600 MG tablet Take 1 tablet (600 mg total) by mouth every 6 (six) hours as needed for mild pain, moderate pain or cramping. 10/14/17   Cheral Marker, CNM  Misc. Devices (BREAST PUMP) MISC Dispense one breast pump for patient 09/23/17   Katrinka Blazing, [place], CNM  Prenatal Vit-Fe Phos-FA-Omega (VITAFOL GUMMIES) 3.33-0.333-34.8 MG CHEW Chew 1 tablet by mouth daily. 08/13/17   Conan Bowens, MD    Family History Family History  Problem Relation Age of Onset  . Hypertension Maternal Grandmother   . Hypertension Maternal Grandfather     Social History Social History   Tobacco Use  . Smoking status: Never Smoker  . Smokeless tobacco: Never Used  Substance Use Topics  . Alcohol use: No  . Drug use: No     Allergies   Patient has no known allergies.   Review of Systems Review of  Systems  Respiratory: Negative.   Cardiovascular: Negative.   Skin: Positive for wound.     Physical Exam Updated Vital Signs BP 110/65 (BP Location: Right Arm)   Pulse 77   Temp 97.7 F (36.5 C) (Oral)   Resp 17   SpO2 100%   Physical Exam  Constitutional: She appears well-developed and well-nourished.  Cardiovascular: Normal rate.  Pulmonary/Chest: Effort normal and breath sounds normal.  Musculoskeletal: Normal range of motion.  Neurological: She is alert.  Skin:  Clean dry wound but wound is not well closed  Nursing note and vitals reviewed.    ED Treatments / Results  Labs (all labs ordered are listed, but only abnormal results are displayed) Labs Reviewed - No data to display  EKG None  Radiology No results found.  Procedures Procedures (including critical care time)  Medications Ordered in ED Medications - No data to display   Initial Impression / Assessment and Plan / ED Course  I have reviewed the triage vital signs and the nursing notes.  Pertinent labs & imaging results that were available during my care of the patient were reviewed by me and considered in  my medical decision making (see chart for details).     Sutures not ready to be removed at this time  Final Clinical Impressions(s) / ED Diagnoses   Final diagnoses:  Visit for wound check    ED Discharge Orders    None       Teressa Lowerickering, Kenshin Splawn, NP 02/07/18 1256    Doug SouJacubowitz, Sam, MD 02/07/18 1807

## 2018-07-19 ENCOUNTER — Encounter: Payer: Self-pay | Admitting: *Deleted

## 2018-11-15 ENCOUNTER — Other Ambulatory Visit: Payer: Self-pay

## 2018-11-15 ENCOUNTER — Ambulatory Visit (HOSPITAL_COMMUNITY)
Admission: EM | Admit: 2018-11-15 | Discharge: 2018-11-15 | Disposition: A | Discharge status: Home / Self Care | Payer: Medicaid Other | Attending: Family Medicine | Admitting: Family Medicine

## 2018-11-15 DIAGNOSIS — R197 Diarrhea, unspecified: Secondary | ICD-10-CM

## 2018-11-15 DIAGNOSIS — J029 Acute pharyngitis, unspecified: Secondary | ICD-10-CM

## 2018-11-15 MED ORDER — ONDANSETRON 4 MG PO TBDP: 4.0000 mg | ORAL_TABLET | Freq: Three times a day (TID) | ORAL | 0 refills | Status: DC | PRN

## 2018-11-15 NOTE — ED Triage Notes (Signed)
Per pt about 1 week ago her throat started becoming sore  with diarrhea no appetite. Pt has been running fevers but she said has broke yesterday. Pt says her throat is still sore and she has achy feeling all over.

## 2018-11-15 NOTE — Discharge Instructions (Signed)
Small frequent sips of fluids- Pedialyte, Gatorade, water, broth- to maintain hydration.   Throat lozenges, gargles, chloraseptic spray, warm teas, popsicles etc to help with throat pain.   Zofran every 8 hours as needed for nausea or vomiting.   Tylenol and/or ibuprofen as needed for pain or fevers.  If symptoms worsen or do not improve in the next week to return to be seen or to follow up with your PCP.

## 2018-11-15 NOTE — ED Provider Notes (Signed)
[facility]    CSN: [id number] Arrival date & time: 11/15/18  1518     History   Chief Complaint Chief Complaint  Patient presents with  . Influenza    possible    HPI Mackenzie Lambert is a 23 y.o. female.   Mackenzie Lambert presents with complaints of symptoms which started 1 week ago. Started as sore throat and fever. No further fevers. Now with diarrhea, last episode prior to arrival. Occurs after eating. No nausea or vomiting. No abdominal pain. Some cough. No runny nose or ear pain. Son also ill. She did get her flu vaccine this season. Has been using tylenol and cough drops which help some. No rash. No chest pain  Or shortness of breath..  Without contributing medical history.      ROS per HPI, negative if not otherwise mentioned.      Past Medical History:  Diagnosis Date  . Medical history non-contributory   . PONV (postoperative nausea and vomiting)     Patient Active Problem List   Diagnosis Date Noted  . Normal labor 10/12/2017  . Supervision of high risk pregnancy, antepartum 04/22/2017  . Prior perinatal loss in second trimester, antepartum 04/22/2017    Past Surgical History:  Procedure Laterality Date  . NO PAST SURGERIES      OB History    Gravida  3   Para  2   Term  1   Preterm  1   AB  1   Living  1     SAB  1   TAB      Ectopic      Multiple  0   Live Births  1            Home Medications    Prior to Admission medications   Medication Sig Start Date End Date Taking? Authorizing Provider  ibuprofen (ADVIL,MOTRIN) 600 MG tablet Take 1 tablet (600 mg total) by mouth every 6 (six) hours as needed for mild pain, moderate pain or cramping. 10/14/17   Cheral Marker, CNM  Misc. Devices (BREAST PUMP) MISC Dispense one breast pump for patient 09/23/17   Katrinka Blazing, [place], CNM  ondansetron (ZOFRAN-ODT) 4 MG disintegrating tablet Take 1 tablet (4 mg total) by mouth every 8 (eight) hours as needed for nausea or vomiting.  11/15/18   Georgetta Haber, NP  Prenatal Vit-Fe Phos-FA-Omega (VITAFOL GUMMIES) 3.33-0.333-34.8 MG CHEW Chew 1 tablet by mouth daily. 08/13/17   Conan Bowens, MD    Family History Family History  Problem Relation Age of Onset  . Hypertension Maternal Grandmother   . Hypertension Maternal Grandfather     Social History Social History   Tobacco Use  . Smoking status: Never Smoker  . Smokeless tobacco: Never Used  Substance Use Topics  . Alcohol use: No  . Drug use: No     Allergies   Patient has no known allergies.   Review of Systems Review of Systems   Physical Exam Triage Vital Signs ED Triage Vitals  Enc Vitals Group     BP 11/15/18 1616 120/64     Pulse Rate 11/15/18 1616 80     Resp 11/15/18 1616 16     Temp 11/15/18 1616 98.7 F (37.1 C)     Temp Source 11/15/18 1616 Temporal     SpO2 11/15/18 1616 99 %     Weight 11/15/18 1615 120 lb (54.4 kg)     Height 11/15/18 1615  (1.549 m)  Head Circumference --      Peak Flow --      Pain Score 11/15/18 1741 0     Pain Loc --      Pain Edu? --      Excl. in GC? --    No data found.  Updated Vital Signs BP 120/64 (BP Location: Right Arm)   Pulse 80   Temp 98.7 F (37.1 C) (Temporal)   Resp 16   Ht 5\' 1"  (1.549 m)   Wt 120 lb (54.4 kg)   SpO2 99%   BMI 22.67 kg/m    Physical Exam Constitutional:      General: She is not in acute distress.    Appearance: She is well-developed.  HENT:     Head: Normocephalic and atraumatic.     Right Ear: Tympanic membrane, ear canal and external ear normal.     Left Ear: Tympanic membrane, ear canal and external ear normal.     Nose: Nose normal.     Mouth/Throat:     Pharynx: Uvula midline.     Tonsils: No tonsillar exudate.  Eyes:     Conjunctiva/sclera: Conjunctivae normal.     Pupils: Pupils are equal, round, and reactive to light.  Cardiovascular:     Rate and Rhythm: Normal rate and regular rhythm.     Heart sounds: Normal heart sounds.   Pulmonary:     Effort: Pulmonary effort is normal.     Breath sounds: Normal breath sounds.  Abdominal:     General: Abdomen is flat. Bowel sounds are normal.     Tenderness: There is no abdominal tenderness.  Skin:    General: Skin is warm and dry.  Neurological:     Mental Status: She is alert and oriented to person, place, and time.      UC Treatments / Results  Labs (all labs ordered are listed, but only abnormal results are displayed) Labs Reviewed - No data to display  EKG None  Radiology No results found.  Procedures Procedures (including critical care time)  Medications Ordered in UC Medications - No data to display  Initial Impression / Assessment and Plan / UC Course  I have reviewed the triage vital signs and the nursing notes.  Pertinent labs & imaging results that were available during my care of the patient were reviewed by me and considered in my medical decision making (see chart for details).     Non toxic. Benign physical exam.  Afebrile. Vague symptoms, now with diarrhea. Likely viral syndrome. Supportive cares recommended. Return precautions provided. If symptoms worsen or do not improve in the next week to return to be seen or to follow up with PCP.  Patient verbalized understanding and agreeable to plan.   Final Clinical Impressions(s) / UC Diagnoses   Final diagnoses:  Diarrhea, unspecified type  Sore throat     Discharge Instructions     Small frequent sips of fluids- Pedialyte, Gatorade, water, broth- to maintain hydration.   Throat lozenges, gargles, chloraseptic spray, warm teas, popsicles etc to help with throat pain.   Zofran every 8 hours as needed for nausea or vomiting.   Tylenol and/or ibuprofen as needed for pain or fevers.  If symptoms worsen or do not improve in the next week to return to be seen or to follow up with your PCP.     ED Prescriptions    Medication Sig Dispense Auth. Provider   ondansetron (ZOFRAN-ODT) 4 MG  disintegrating tablet Take 1  tablet (4 mg total) by mouth every 8 (eight) hours as needed for nausea or vomiting. 12 tablet Georgetta Haber, NP     Controlled Substance Prescriptions [place] Controlled Substance Registry consulted? Not Applicable   Georgetta Haber, NP 11/15/18 2036

## 2018-12-30 ENCOUNTER — Encounter: Payer: Self-pay | Admitting: *Deleted

## 2019-02-08 IMAGING — US US OB TRANSVAGINAL
1 series · 15 of 28 positions shown · non-contrast
Comparison: 05/08/2015 pelvic ultrasound

ADDENDUM:
Dictation error with correction to section "FINDINGS" as follows:

Intrauterine gestational sac: None
Yolk sac:  None
Embryo:  None
Cardiac Activity:  N/A
By: Agnaw De Kech M.D.
CLINICAL DATA: Right lower back pain beginning at work today.
EXAM:
OBSTETRIC <14 WK US AND TRANSVAGINAL OB US
TECHNIQUE: Both transabdominal and transvaginal ultrasound examinations were
performed for complete evaluation of the gestation as well as the
maternal uterus, adnexal regions, and pelvic cul-de-sac.
Transvaginal technique was performed to assess early pregnancy.

[Series 1: us ob transvaginal · 15 of 114 slices shown]
[im 1/114]
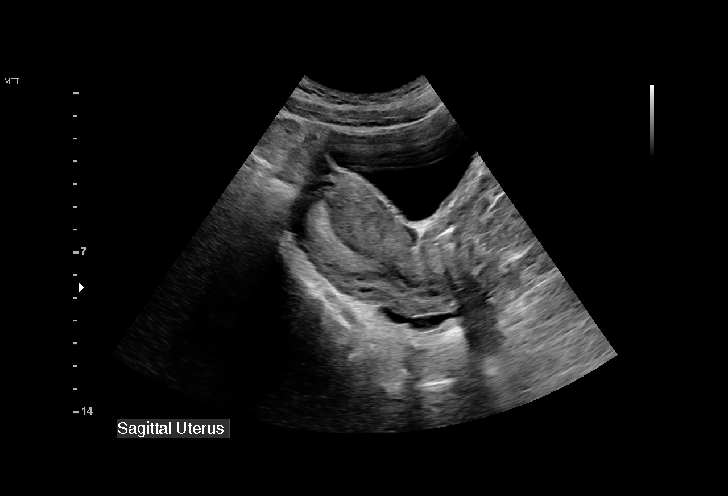
[im 9/114]
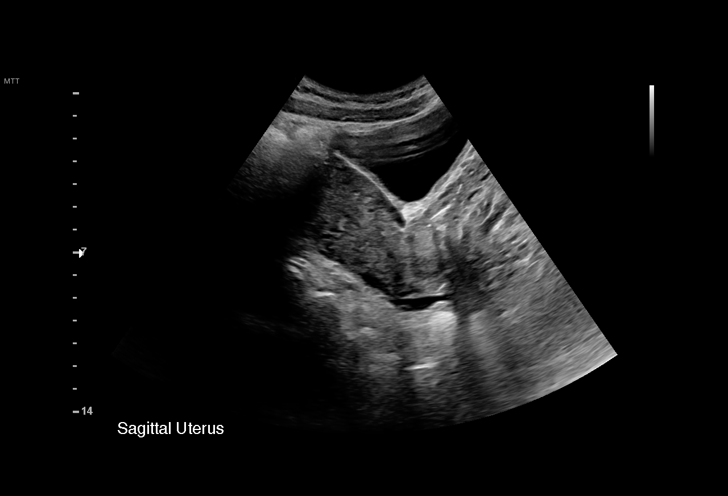
[im 17/114]
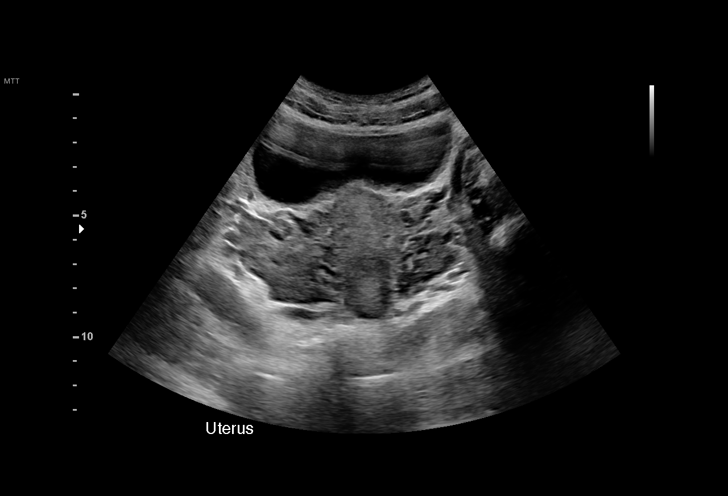
[im 26/114]
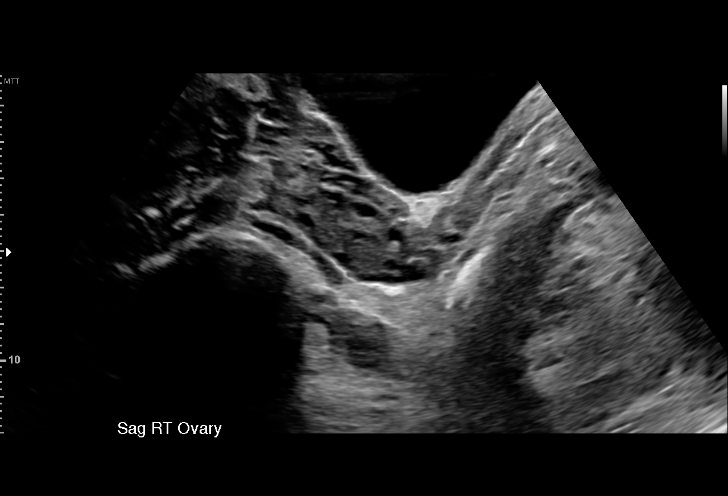
[im 34/114]
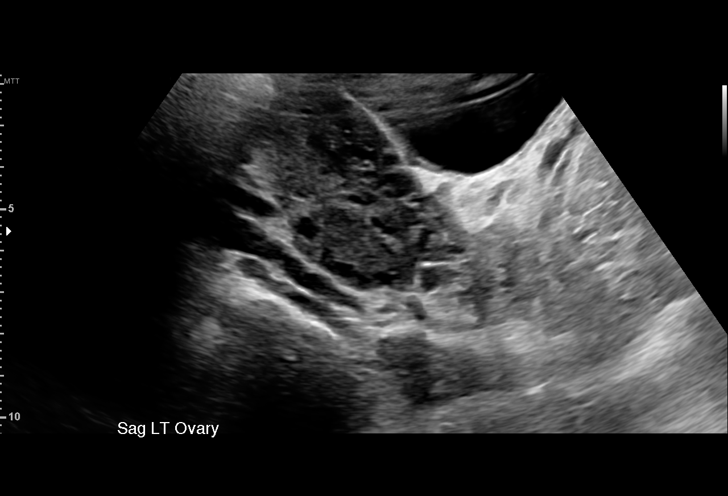
[im 42/114]
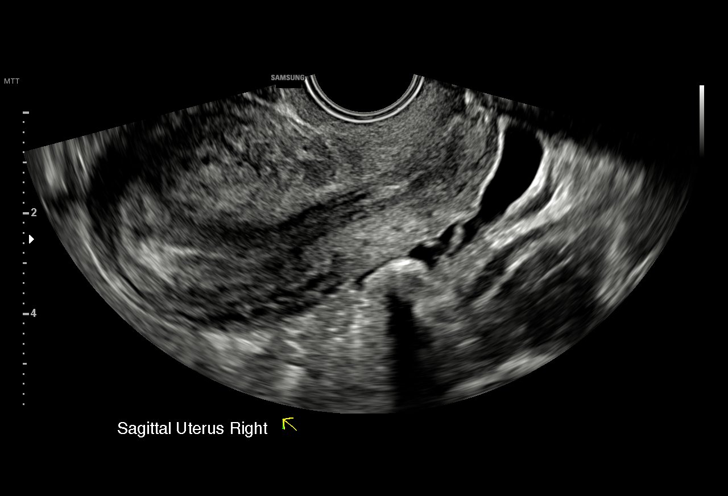
[im 51/114]
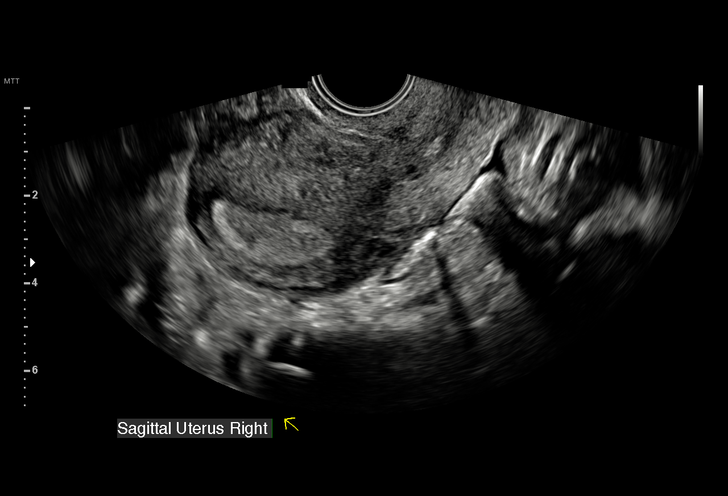
[im 59/114]
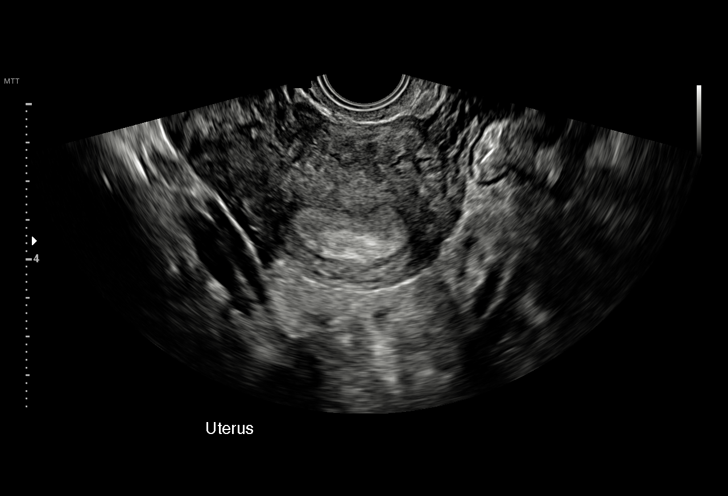
[im 63/114]
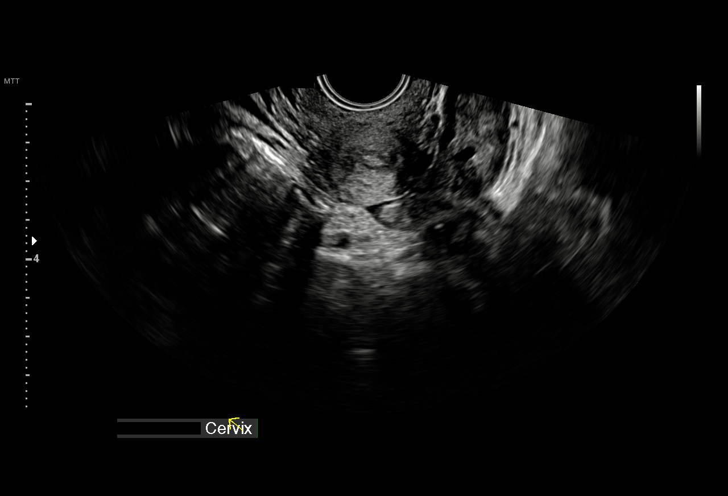
[im 72/114]
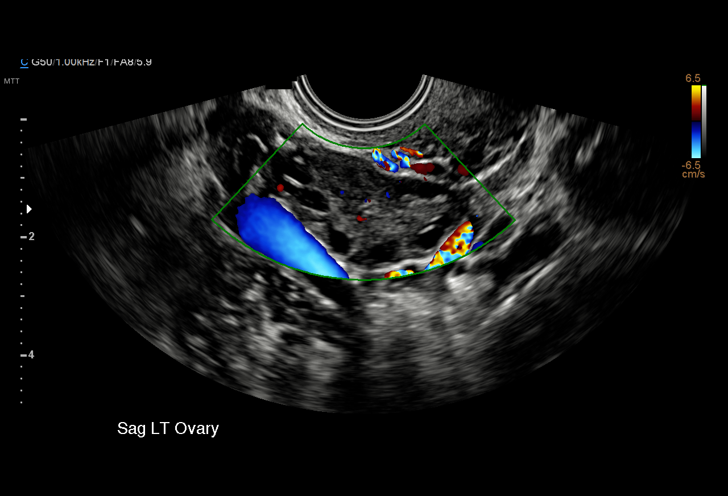
[im 80/114]
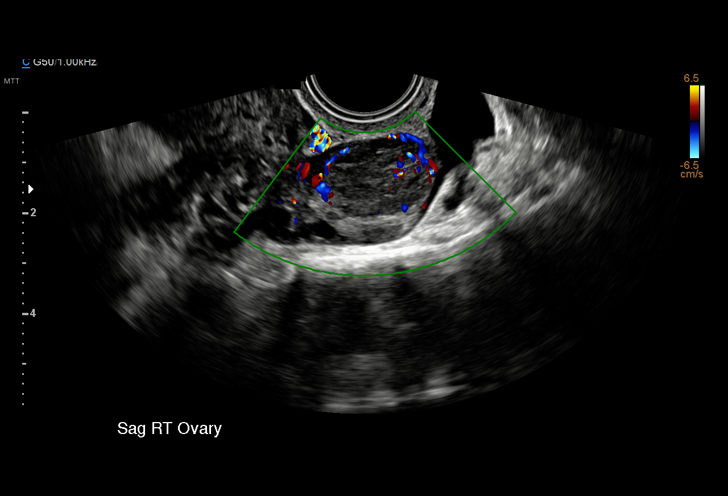
[im 88/114]
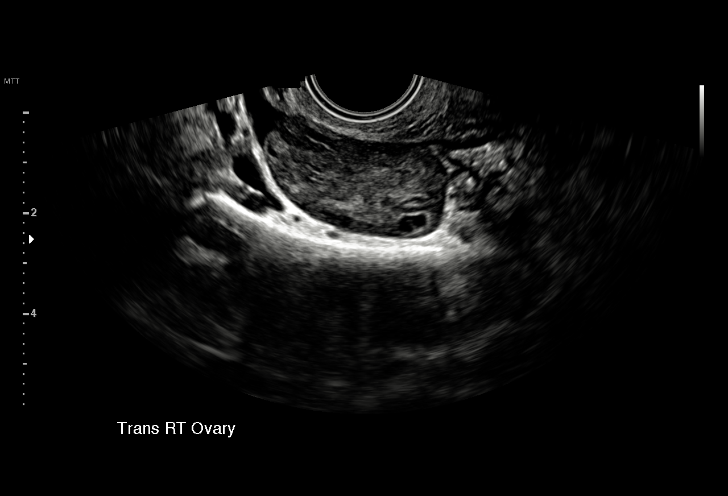
[im 97/114]
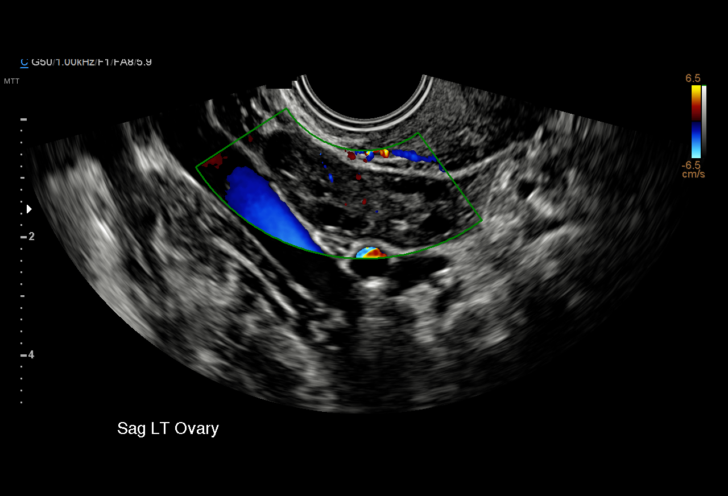
[im 105/114]
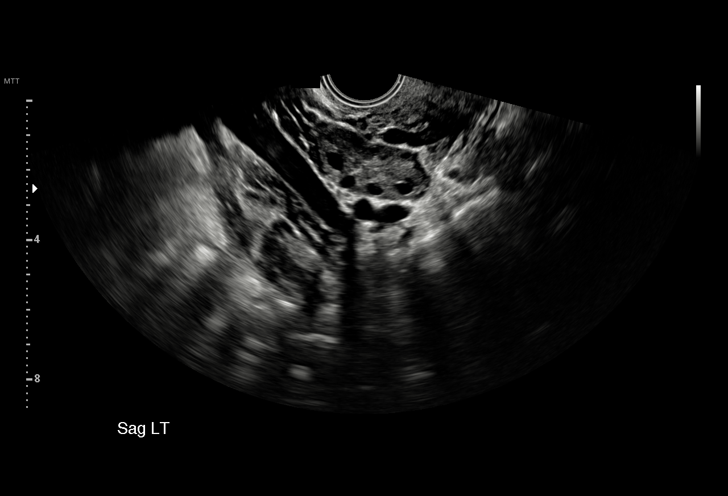
[im 114/114]
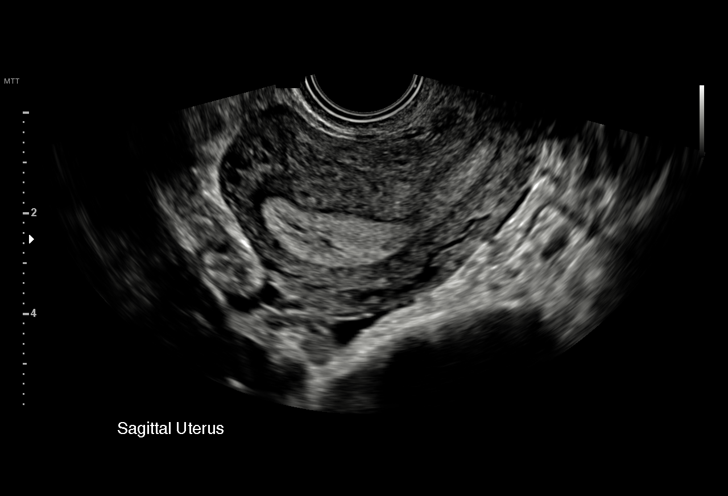

[15 of 28 positions shown; findings below may reference images not displayed]

FINDINGS: Intrauterine gestational sac: None

Yolk sac:  Visualized.

Embryo:  Visualized.

Cardiac Activity: Visualized.

Maternal uterus/adnexae: Endometrium is thickened to 12 mm and
mildly heterogeneous. Bilateral ovaries are normal in appearance.
Right ovary corpus luteum cyst. No adnexal mass identified.

Other: There is a small volume of anechoic simple fluid within the
pelvis, probably physiologic.
IMPRESSION: Pregnancy of unknown location. Intrauterine pregnancy may not be
identified with a beta HCG of less than [DATE]. Follow-up beta HCG
and possible pelvic ultrasound as clinically indicated is
recommended to evaluate pregnancy location. This recommendation
follows SRU consensus guidelines: Diagnostic Criteria for Nonviable
Pregnancy Early in the First Trimester. N Engl J Med 9864;

By: Agnaw De Kech M.D.

## 2019-02-18 IMAGING — US US OB TRANSVAGINAL
1 series · 15 of 28 positions shown · non-contrast
Comparison: February 24, 2017

CLINICAL DATA: Positive pregnancy test with indeterminate recent
obstetrical ultrasound

EXAM:
TRANSVAGINAL OB ULTRASOUND
TECHNIQUE: Transvaginal ultrasound was performed for complete evaluation of the
gestation as well as the maternal uterus, adnexal regions, and
pelvic cul-de-sac.

[Series 1: us ob transvaginal · 15 of 77 slices shown]
[im 1/77]
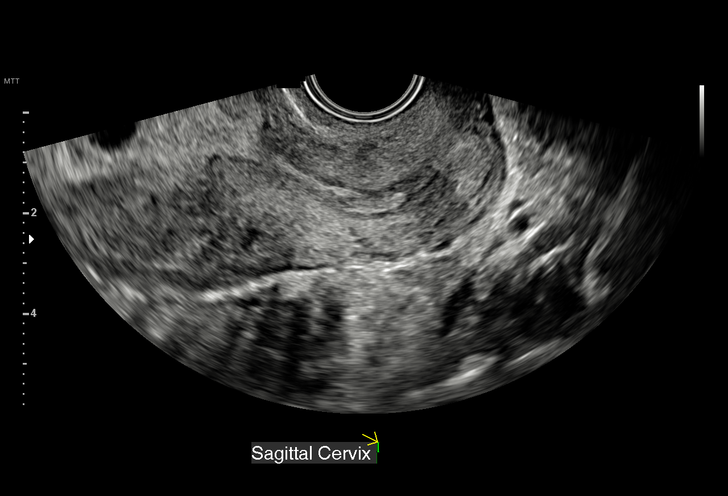
[im 6/77]
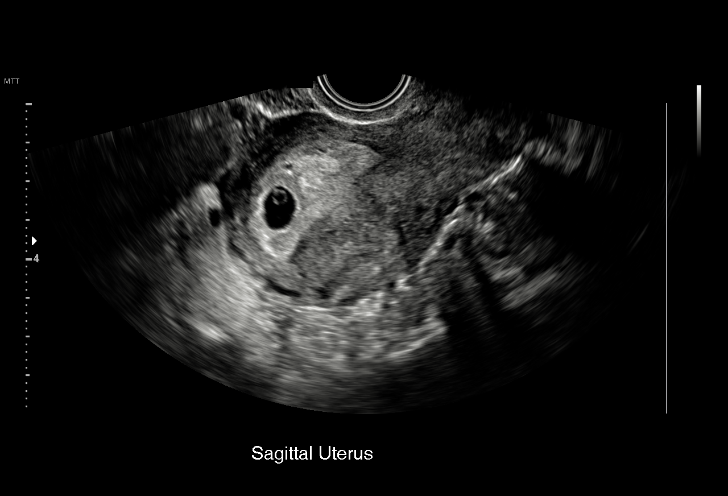
[im 12/77]
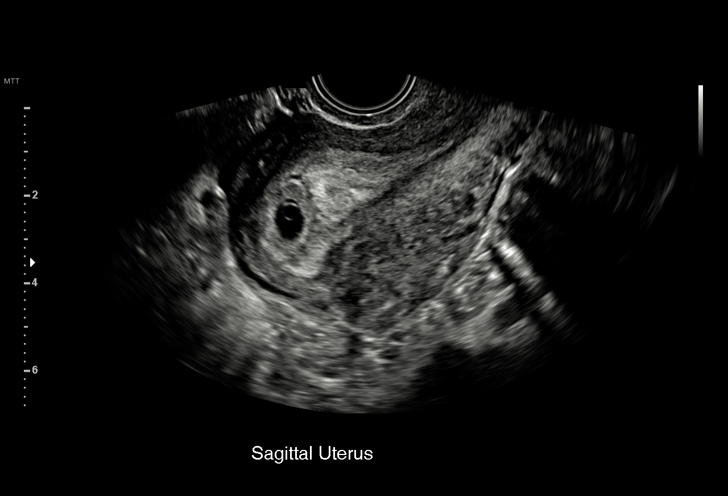
[im 17/77]
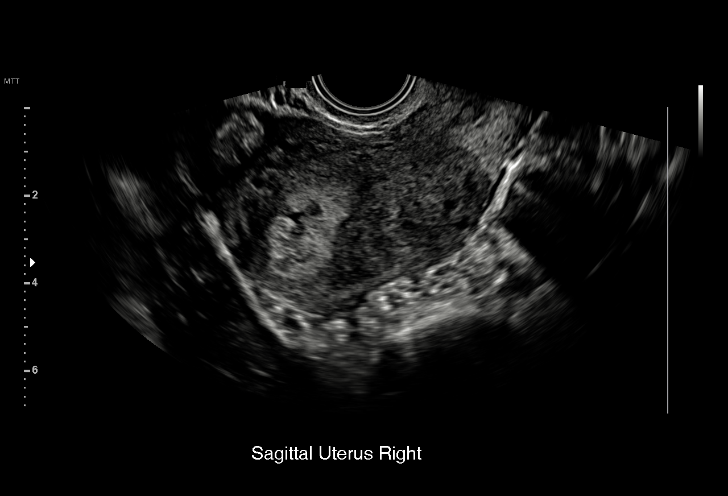
[im 23/77]
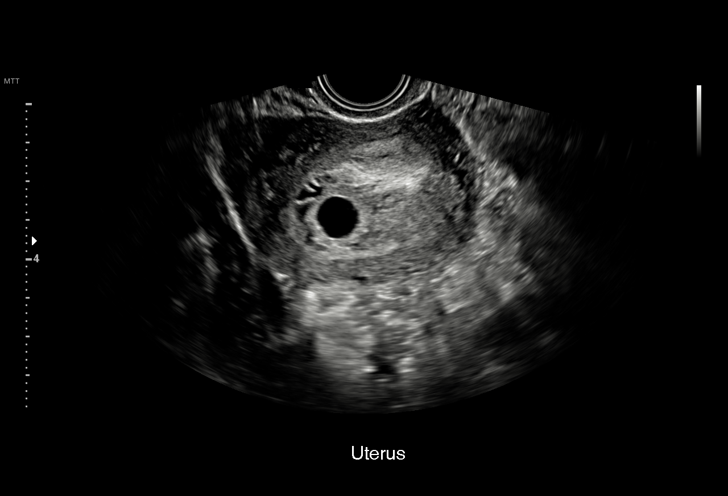
[im 29/77]
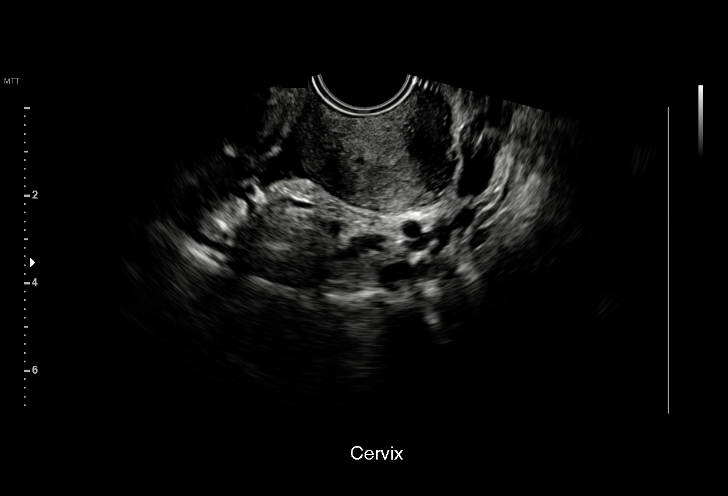
[im 34/77]
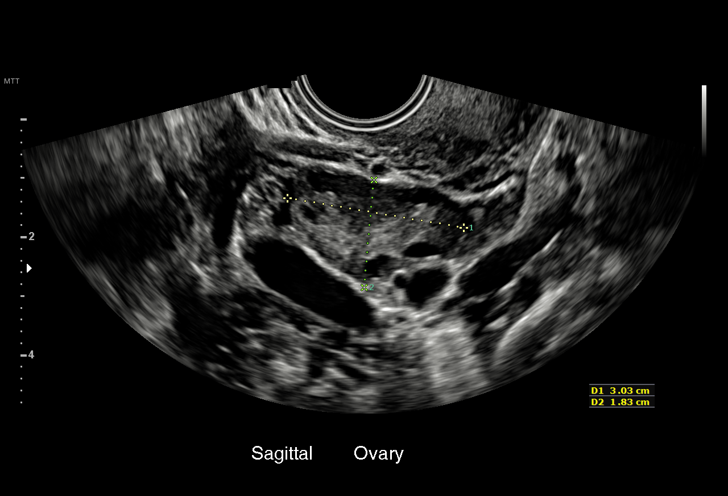
[im 40/77]
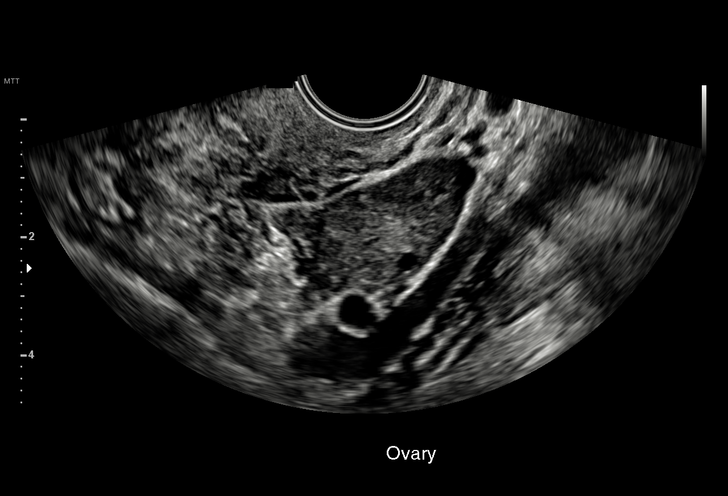
[im 43/77]
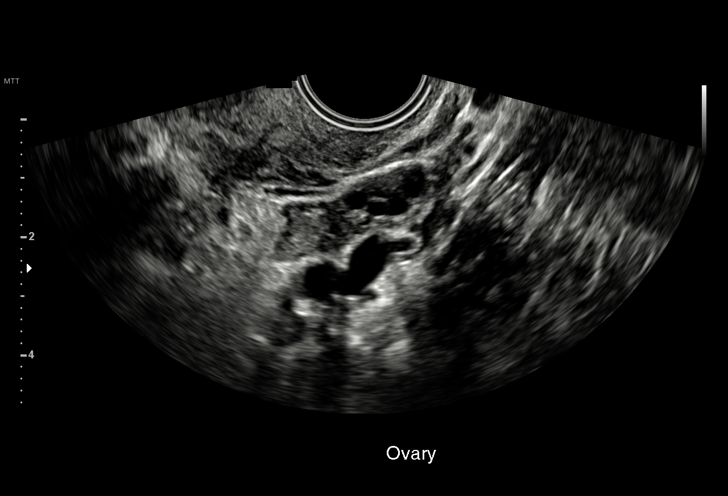
[im 48/77]
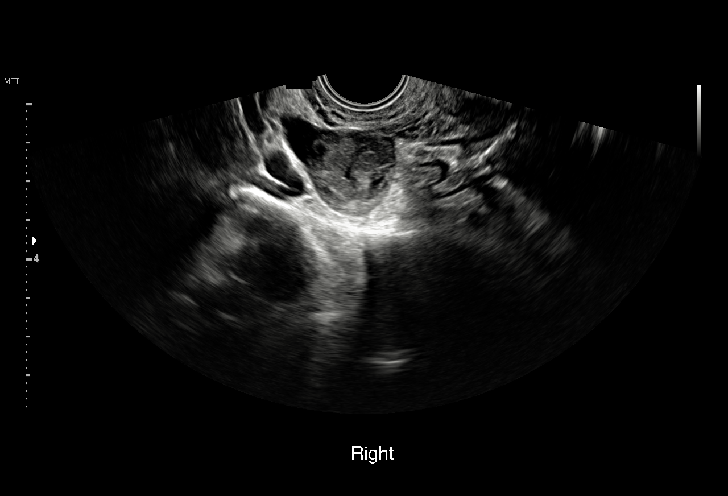
[im 54/77]
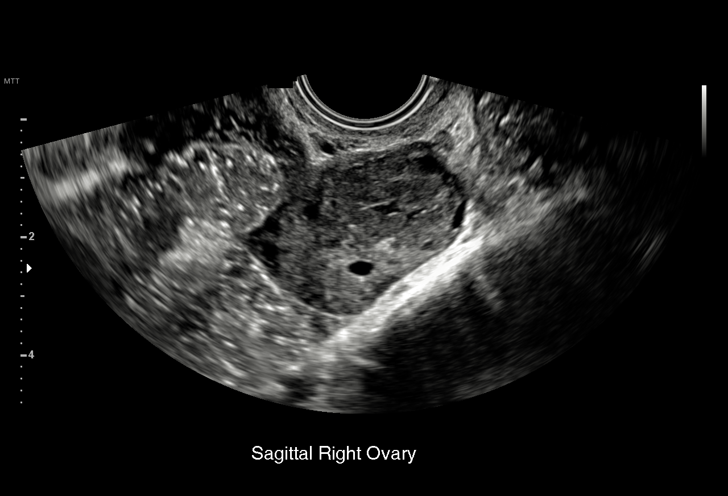
[im 60/77]
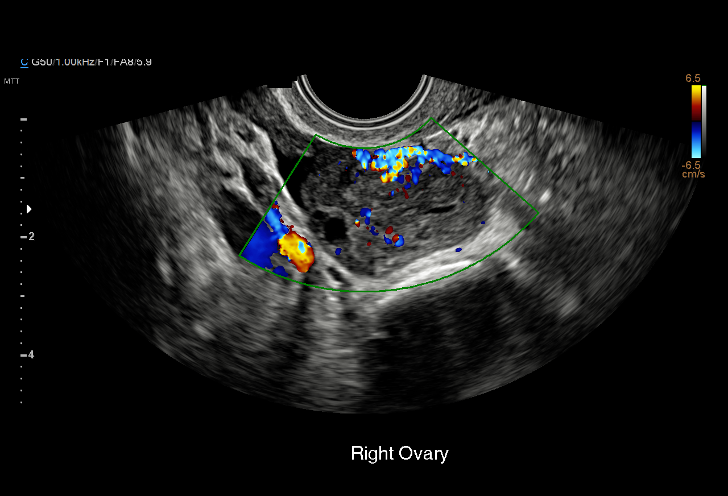
[im 65/77]
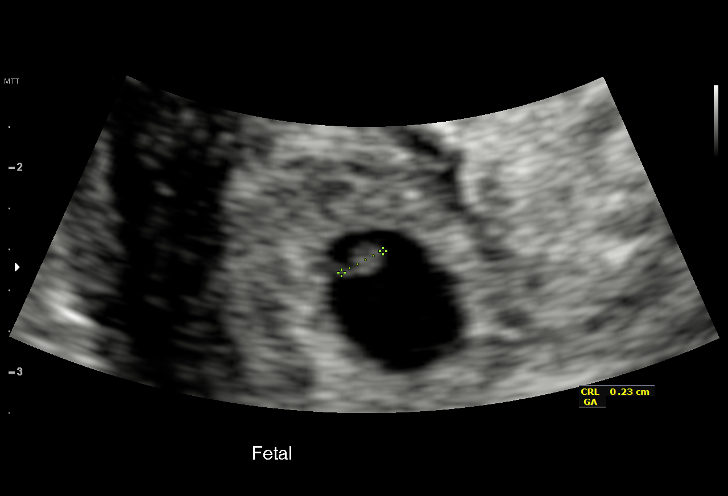
[im 71/77]
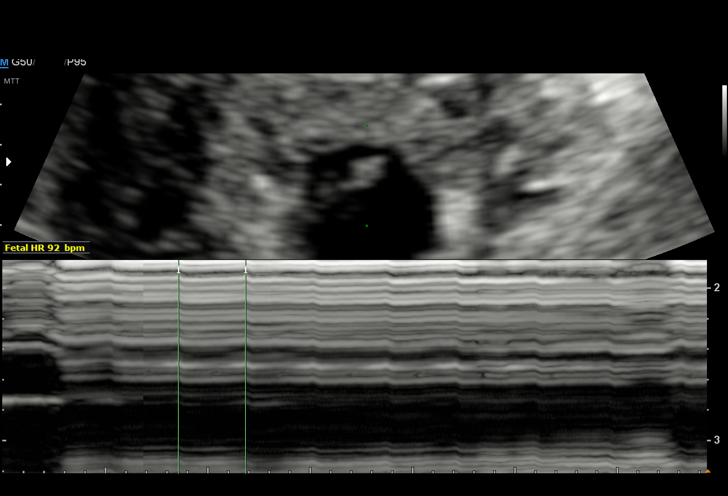
[im 77/77]
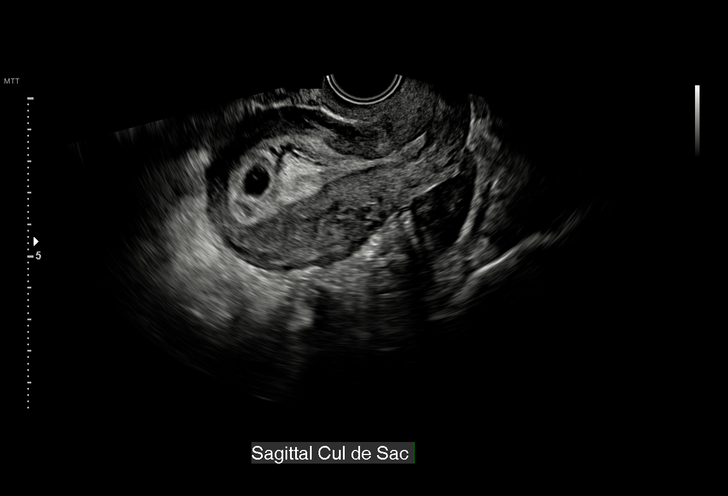

[15 of 28 positions shown; findings below may reference images not displayed]

FINDINGS: Intrauterine gestational sac: Visualized

Yolk sac:  Visualized

Embryo:  Visualized

Cardiac Activity: Visualized

Heart Rate: 92 bpm

CRL:   2  mm   5 w 5 d                  US EDC: November 01, 2017

Subchorionic hemorrhage: There is a 1.0 x 0.7 cm subchorionic
hemorrhage.

Maternal uterus/adnexae: Cervical os is closed. There is a probable
hemorrhagic corpus luteum on the right measuring 1.9 x 1.6 cm. No
other extrauterine pelvic mass evident. There is trace free pelvic
fluid.
IMPRESSION: Single live intrauterine gestation with estimated gestational age of
6- weeks. Fairly small subchorionic hemorrhage. Probable hemorrhagic
corpus luteum on the right.

## 2019-03-18 ENCOUNTER — Encounter (HOSPITAL_COMMUNITY): Payer: Self-pay

## 2019-03-18 ENCOUNTER — Ambulatory Visit (HOSPITAL_COMMUNITY)
Admission: EM | Admit: 2019-03-18 | Discharge: 2019-03-18 | Disposition: A | Discharge status: Home / Self Care | Payer: Medicaid Other | Attending: Emergency Medicine | Admitting: Emergency Medicine

## 2019-03-18 ENCOUNTER — Other Ambulatory Visit: Payer: Self-pay

## 2019-03-18 DIAGNOSIS — S0502XA Injury of conjunctiva and corneal abrasion without foreign body, left eye, initial encounter: Secondary | ICD-10-CM

## 2019-03-18 MED ORDER — ERYTHROMYCIN 5 MG/GM OP OINT: TOPICAL_OINTMENT | OPHTHALMIC | 0 refills | Status: DC

## 2019-03-18 MED ORDER — LUBRICANT EYE DROPS 0.4-0.3 % OP SOLN: 1.0000 [drp] | Freq: Three times a day (TID) | OPHTHALMIC | 0 refills | Status: DC | PRN

## 2019-03-18 NOTE — ED Provider Notes (Signed)
[facility]    CSN: [id number] Arrival date & time: 03/18/19  1306     History   Chief Complaint Chief Complaint  Patient presents with  . Eye Pain    Left    HPI Mackenzie Lambert is a 23 y.o. female presented for acute concern of left eye pain.  Patient states that she was scratched by her son in his sleep last night.  Patient endorsing pain at time of incident, as well as today.  Patient endorsing photophobia, foreign body sensation, as well as watery discharge.  Patient denies fever, ear pain, bruising.  Patient does wear glasses, does not wear contacts.  Denies change in vision, headache, known foreign body exposure.   Past Medical History:  Diagnosis Date  . Medical history non-contributory   . PONV (postoperative nausea and vomiting)     Patient Active Problem List   Diagnosis Date Noted  . Normal labor 10/12/2017  . Supervision of high risk pregnancy, antepartum 04/22/2017  . Prior perinatal loss in second trimester, antepartum 04/22/2017    Past Surgical History:  Procedure Laterality Date  . NO PAST SURGERIES      OB History    Gravida  3   Para  2   Term  1   Preterm  1   AB  1   Living  1     SAB  1   TAB      Ectopic      Multiple  0   Live Births  1            Home Medications    Prior to Admission medications   Medication Sig Start Date End Date Taking? Authorizing Provider  erythromycin ophthalmic ointment Place a 1/2 inch ribbon of ointment into the lower eyelid. 03/18/19   Hall-Potvin, [place], PA-C  ibuprofen (ADVIL,MOTRIN) 600 MG tablet Take 1 tablet (600 mg total) by mouth every 6 (six) hours as needed for mild pain, moderate pain or cramping. 10/14/17   Cheral MarkerBooker, Kimberly R, CNM  Misc. Devices (BREAST PUMP) MISC Dispense one breast pump for patient 09/23/17   Katrinka BlazingSmith, [place], CNM  ondansetron (ZOFRAN-ODT) 4 MG disintegrating tablet Take 1 tablet (4 mg total) by mouth every 8 (eight) hours as needed for nausea or  vomiting. 11/15/18   Georgetta HaberBurky, Natalie B, NP  Polyethyl Glycol-Propyl Glycol (LUBRICANT EYE DROPS) 0.4-0.3 % SOLN Apply 1 drop to eye 3 (three) times daily as needed. 03/18/19   Hall-Potvin, [place], PA-C  Prenatal Vit-Fe Phos-FA-Omega (VITAFOL GUMMIES) 3.33-0.333-34.8 MG CHEW Chew 1 tablet by mouth daily. 08/13/17   Conan Bowensavis, Kelly M, MD    Family History Family History  Problem Relation Age of Onset  . Hypertension Maternal Grandmother   . Hypertension Maternal Grandfather   . Healthy Mother   . Healthy Father     Social History Social History   Tobacco Use  . Smoking status: Never Smoker  . Smokeless tobacco: Never Used  Substance Use Topics  . Alcohol use: No  . Drug use: No     Allergies   Patient has no known allergies.   Review of Systems As per HPI   Physical Exam Triage Vital Signs ED Triage Vitals  Enc Vitals Group     BP 03/18/19 1405 104/66     Pulse Rate 03/18/19 1405 64     Resp 03/18/19 1405 16     Temp 03/18/19 1405 99 F (37.2 C)     Temp Source 03/18/19 1405  Oral     SpO2 03/18/19 1405 100 %     Weight --      Height --      Head Circumference --      Peak Flow --      Pain Score 03/18/19 1403 8     Pain Loc --      Pain Edu? --      Excl. in [place]? --    No data found.  Updated Vital Signs BP 104/66 (BP Location: Right Arm)   Pulse 64   Temp 99 F (37.2 C) (Oral)   Resp 16   SpO2 100%   Visual Acuity Right Eye Distance:   Left Eye Distance:   Bilateral Distance:    Right Eye Near:   Left Eye Near:    Bilateral Near:     Physical Exam Constitutional:      General: She is not in acute distress.    Appearance: She is normal weight. She is not ill-appearing.  HENT:     Head: Normocephalic and atraumatic.     Right Ear: Tympanic membrane, ear canal and external ear normal.     Left Ear: Tympanic membrane, ear canal and external ear normal.     Nose: Nose normal.     Mouth/Throat:     Mouth: Mucous membranes are moist.      Pharynx: Oropharynx is clear. No oropharyngeal exudate or posterior oropharyngeal erythema.  Eyes:     General: No scleral icterus.       Right eye: No discharge.        Left eye: Discharge present.    Extraocular Movements: Extraocular movements intact.     Pupils: Pupils are equal, round, and reactive to light.     Comments: Left eye with mild to moderate conjunctival injection, clear watery discharge.  EOMs are nontender.  Pupils equal round reactive to light bilaterally.  Pain improved with tetracaine eyedrops.  No foreign body visualized with upper/lower lid eversion.  Fluorescein dye uptake on supramedial aspect of eye.  Cardiovascular:     Rate and Rhythm: Normal rate.  Pulmonary:     Effort: Pulmonary effort is normal.  Skin:    Coloration: Skin is not jaundiced or pale.  Neurological:     Mental Status: She is alert and oriented to person, place, and time.      UC Treatments / Results  Labs (all labs ordered are listed, but only abnormal results are displayed) Labs Reviewed - No data to display  EKG   Radiology No results found.  Procedures Procedures (including critical care time)  Medications Ordered in UC Medications - No data to display  Initial Impression / Assessment and Plan / UC Course  I have reviewed the triage vital signs and the nursing notes.  Pertinent labs & imaging results that were available during my care of the patient were reviewed by me and considered in my medical decision making (see chart for details).     23 year old female presenting for concern of left eye pain since last night.  History and physical highly suggestive of corneal abrasion.  Will treat with lubricant eyedrops and erythromycin ointment.  Return precautions discussed, patient verbalized understanding and is agreeable to plan. Final Clinical Impressions(s) / UC Diagnoses   Final diagnoses:  Abrasion of left cornea, initial encounter     Discharge Instructions      Use eyedrops up to 3 times daily for additional pain relief. Use ointment at bedtime with  clean hands. Follow-up with eye doctor should your pain worsen/not improve over the next 2 to 3 days, or you develop changes to your vision, painful eye movements.    ED Prescriptions    Medication Sig Dispense Auth. Provider   Polyethyl Glycol-Propyl Glycol (LUBRICANT EYE DROPS) 0.4-0.3 % SOLN Apply 1 drop to eye 3 (three) times daily as needed. 10 mL Hall-Potvin, [place], PA-C   erythromycin ophthalmic ointment Place a 1/2 inch ribbon of ointment into the lower eyelid. 3.5 g Hall-Potvin, [place], PA-C     Controlled Substance Prescriptions [place] Controlled Substance Registry consulted? Not Applicable   Shea EvansHall-Potvin, , [place] 03/18/19 1433

## 2019-03-18 NOTE — ED Triage Notes (Signed)
Patient presents to Urgent Care with complaints of left eye pain since last night when she thinks her son scratched it. Patient reports it has been tearing up and it feels like something is stuck on the inside.

## 2019-03-18 NOTE — Discharge Instructions (Signed)
Use eyedrops up to 3 times daily for additional pain relief. Use ointment at bedtime with clean hands. Follow-up with eye doctor should your pain worsen/not improve over the next 2 to 3 days, or you develop changes to your vision, painful eye movements.

## 2019-04-06 IMAGING — US US MFM FETAL NUCHAL TRANSLUCENCY
1 series · 15 of 28 positions shown · non-contrast
Comparison: none

[Series 1: us mfm fetal nuchal translucency · 15 of 46 slices shown]
[im 1/46]
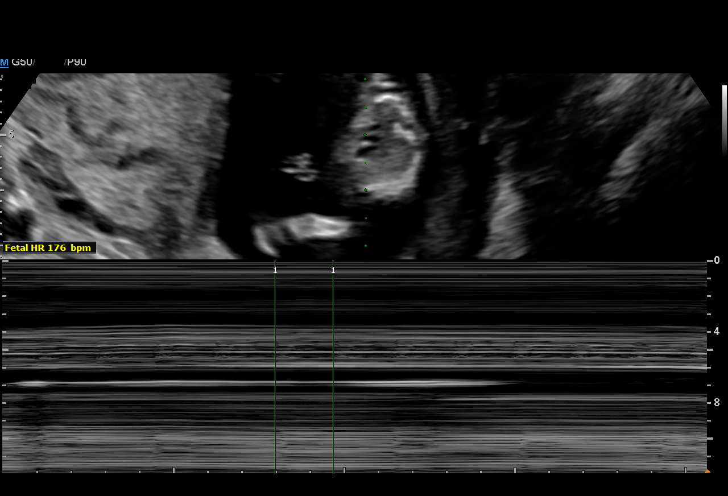
[im 4/46]
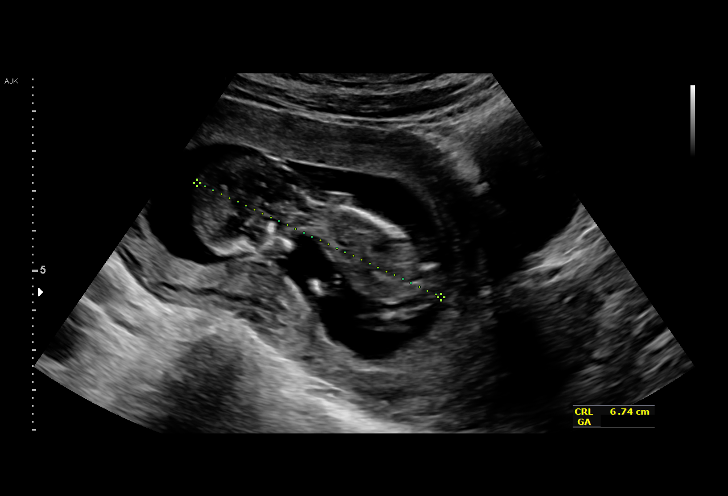
[im 7/46]
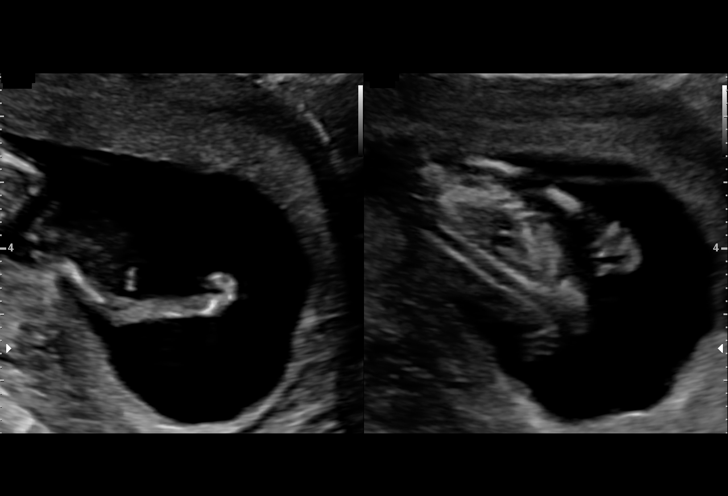
[im 11/46]
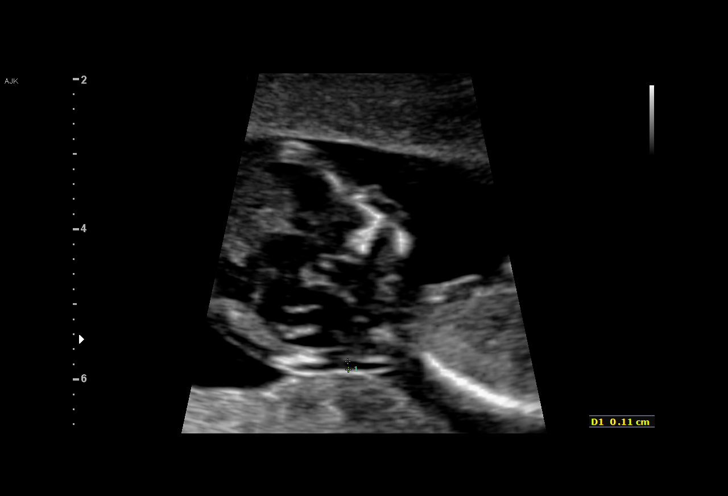
[im 14/46]
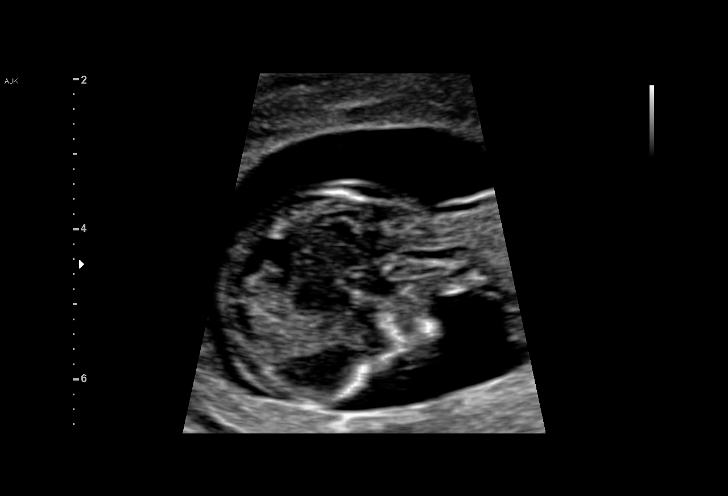
[im 17/46]
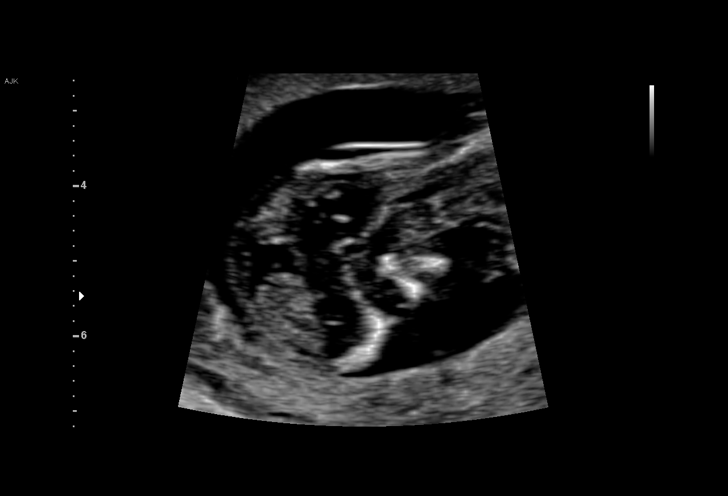
[im 21/46]
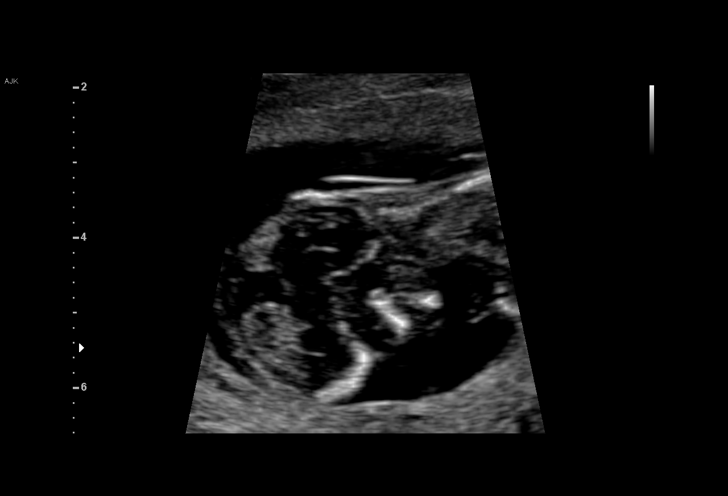
[im 24/46]
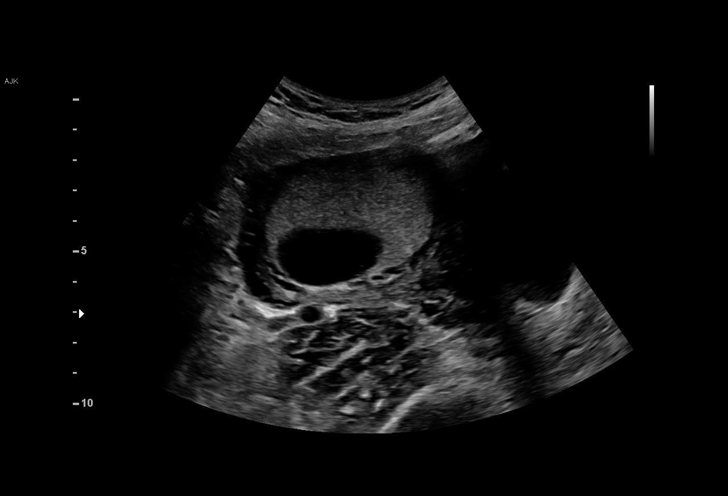
[im 26/46]
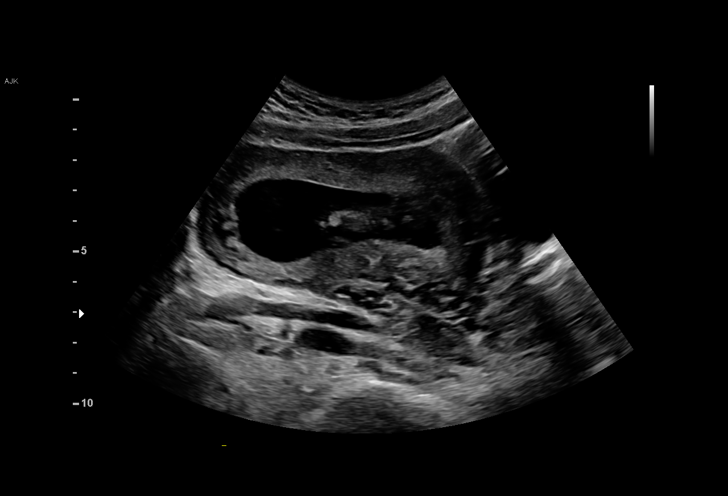
[im 29/46]
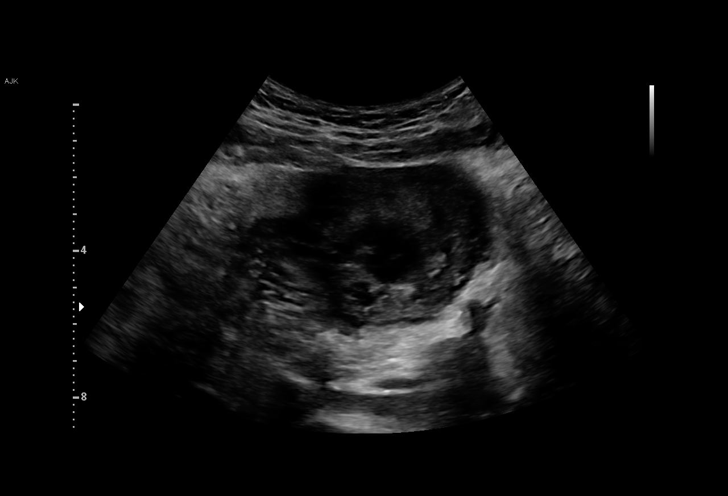
[im 32/46]
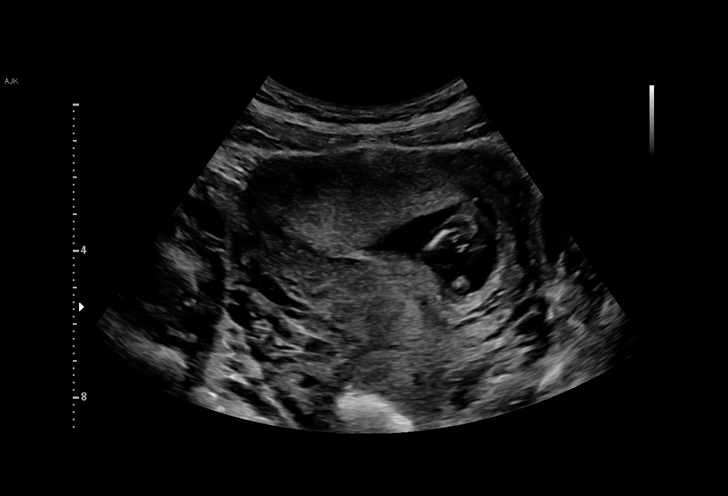
[im 36/46]
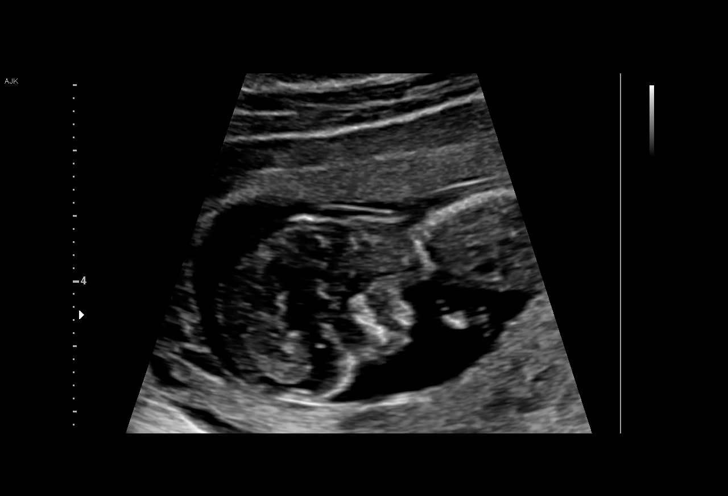
[im 39/46]
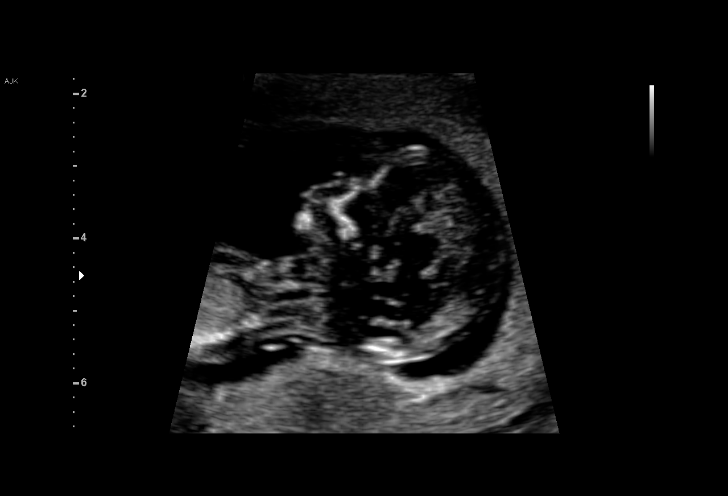
[im 42/46]
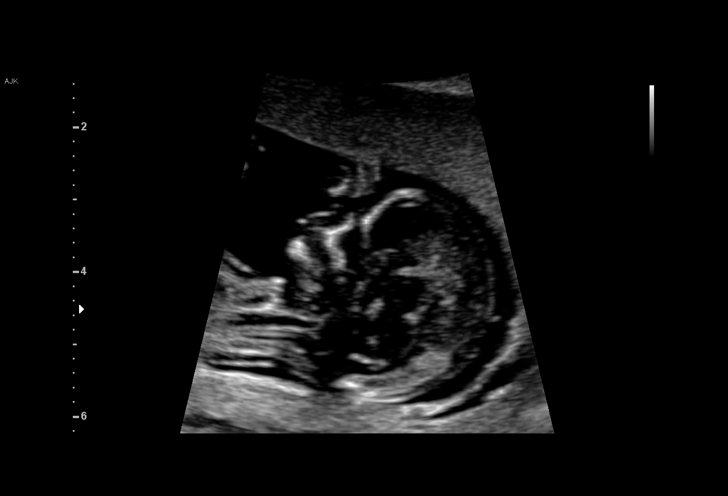
[im 46/46]
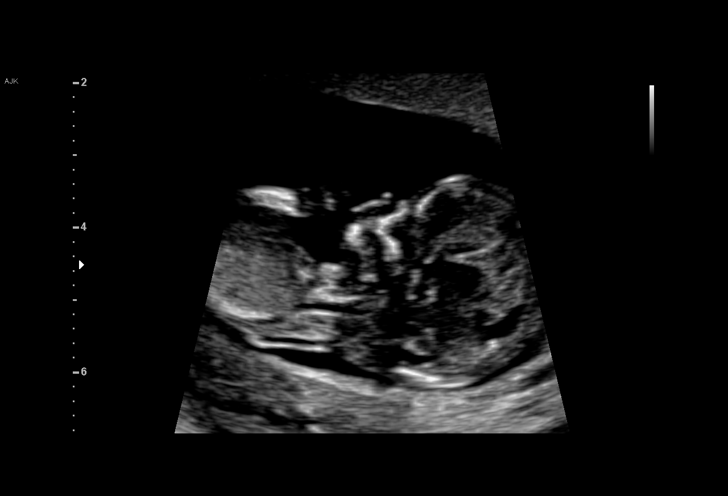

[15 of 28 positions shown; findings below may reference images not displayed]

TRANSLUCENCY

1  KUSHKAKI                [id number]      [id number]     [id number]
NSAB
Indications

12 weeks gestation of pregnancy
Encounter for nuchal translucency
Poor obstetric history: Previous preterm
delivery, antepartum (21 weeks;home birth)
OB History

Blood Type:            Height:  5'1"   Weight (lb):  117      BMI:
Gravidity:    3         Term:   0        Prem:   1        SAB:   1
TOP:          0       Ectopic:  0        Living: 0
Fetal Evaluation

Num Of Fetuses:     1
Fetal Heart         176
Rate(bpm):
Cardiac Activity:   Observed
Presentation:       Variable
Placenta:           Anterior, above cervical os

Amniotic Fluid
AFI FV:      Subjectively within normal limits
Biometry

CRL:      65.7  mm     G. Age:  12w 5d                  EDD:   10/30/17
Gestational Age
LMP:           12w 5d       Date:   01/23/17                 EDD:   10/30/17
Best:          12w 3d    Det. By:   Previous Ultrasound      EDD:   11/01/17
(03/06/17)
1st Trimester Genetic Sonogram Screening

CRL:            65.7  mm    G. Age:   12w 5d                 EDD:   10/30/17
Nuc Trans:       1.1  mm
Nasal Bone:                 Present
Cervix Uterus Adnexa

Cervix
Normal appearance by transabdominal scan.

Uterus
No abnormality visualized.

Left Ovary
Within normal limits.

Right Ovary
Within normal limits.

Adnexa:       No abnormality visualized.
Comments

The patient presented for nuchal translucency screen as part
of a first trimester screen.  Blood drawn for serum analytes
today.
Impression

Single living intrauterine pregnancy at 12 weeks 3 days.
No gross fetal anomalies identified.
Normal nuchal translucency.
Nasal bone present.
Recommendations

Recommend follow-up ultrasound examination in 6 weeks for
detailed fetal anatomic survey.

## 2019-06-24 IMAGING — US US MFM OB TRANSVAGINAL
1 series · 14 of 28 positions shown · non-contrast
Comparison: none

[Series 1: us mfm ob transvaginal · 50 acquisitions, 14 frames shown]
[im 2/50]
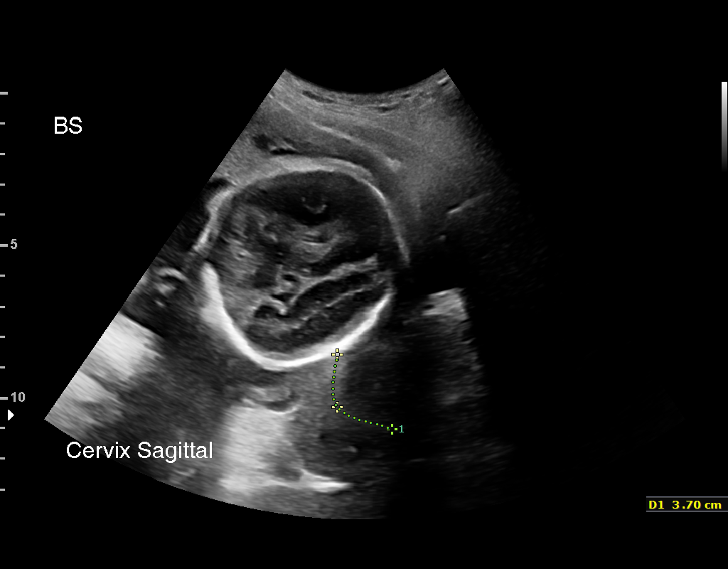
[im 6/50]
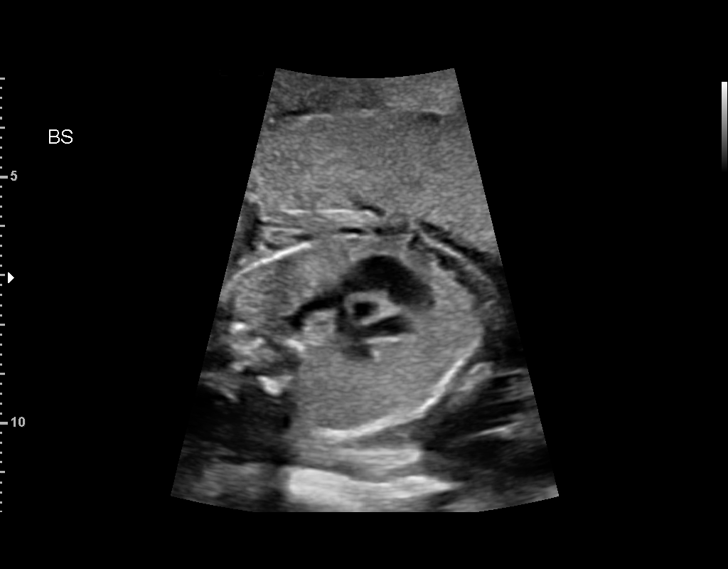
[im 10/50]
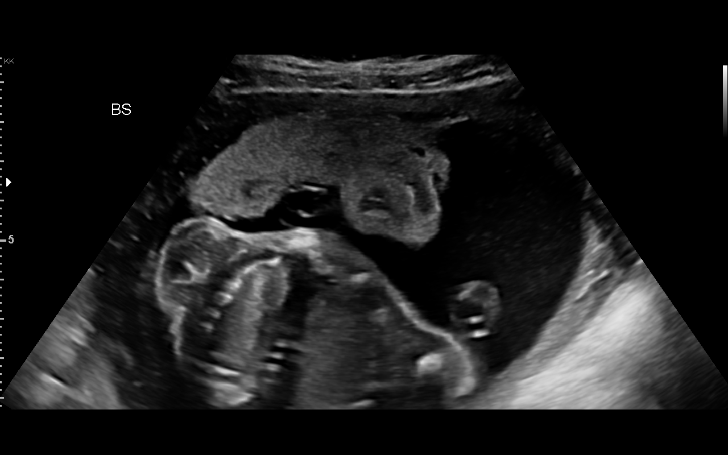
[im 13/50]
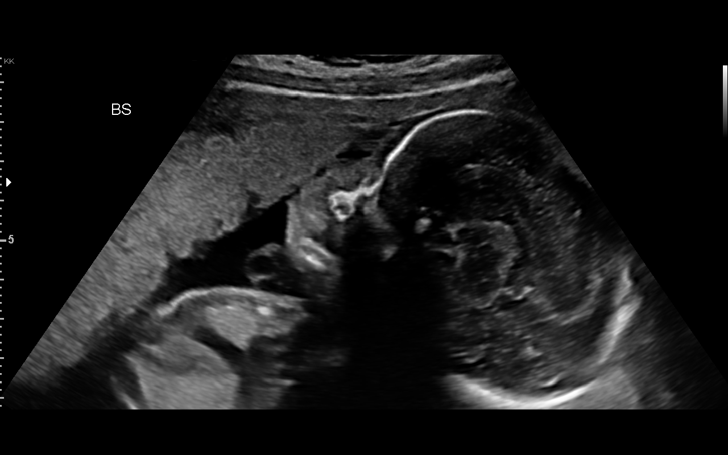
[im 17/50]
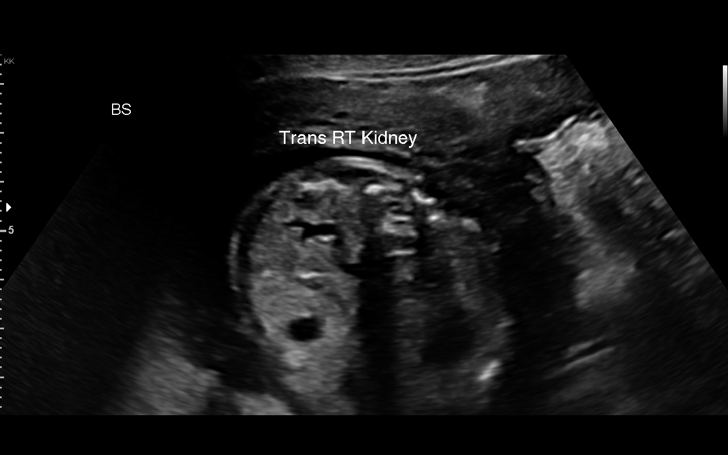
[im 20/50]
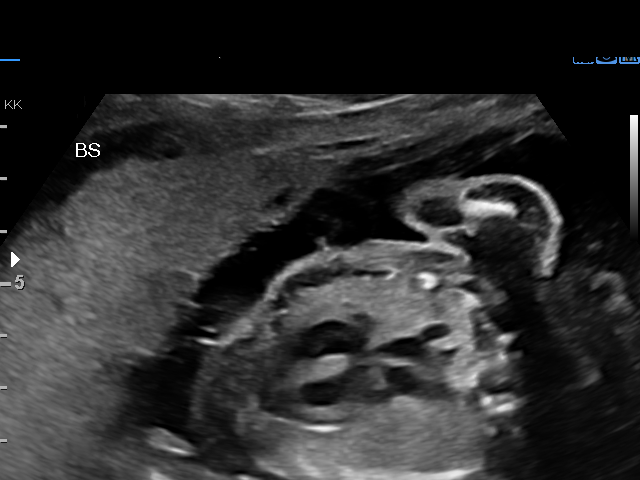
[im 24/50]
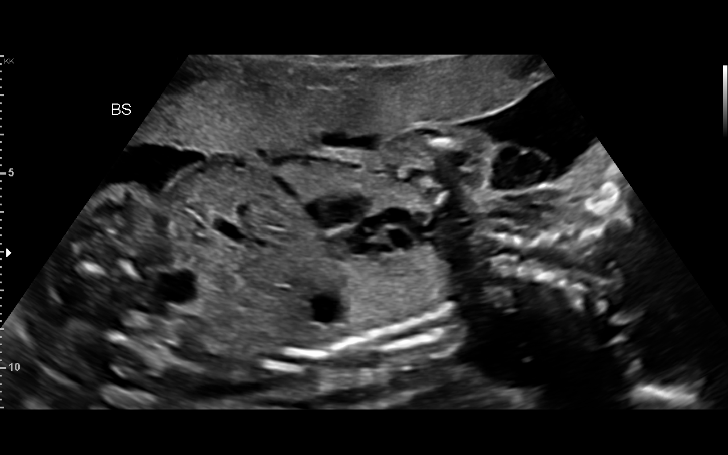
[im 28/50]
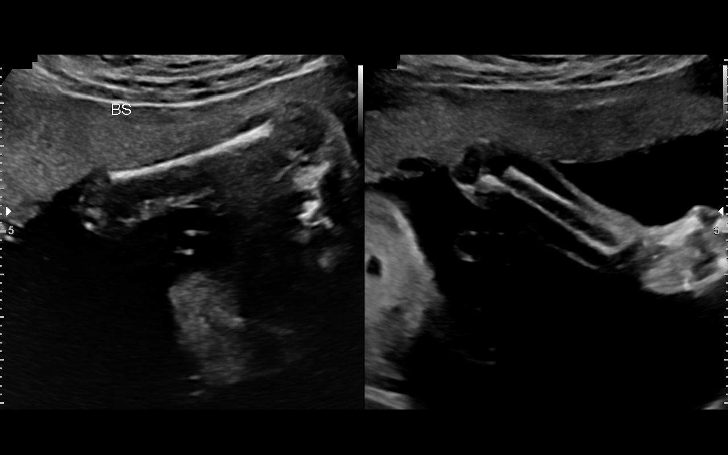
[im 31/50]
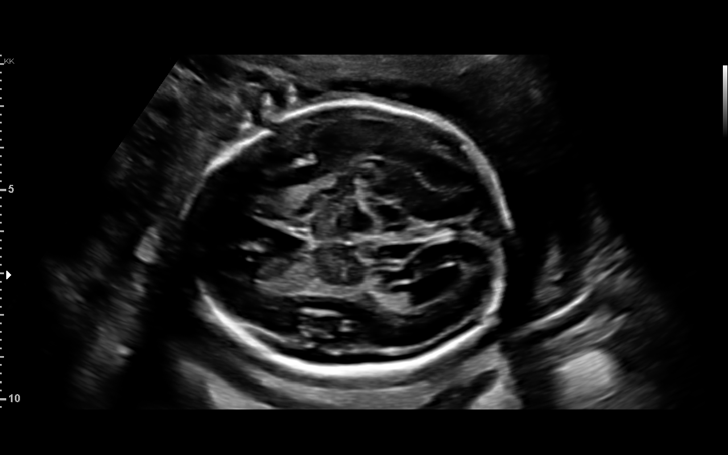
[im 35/50]
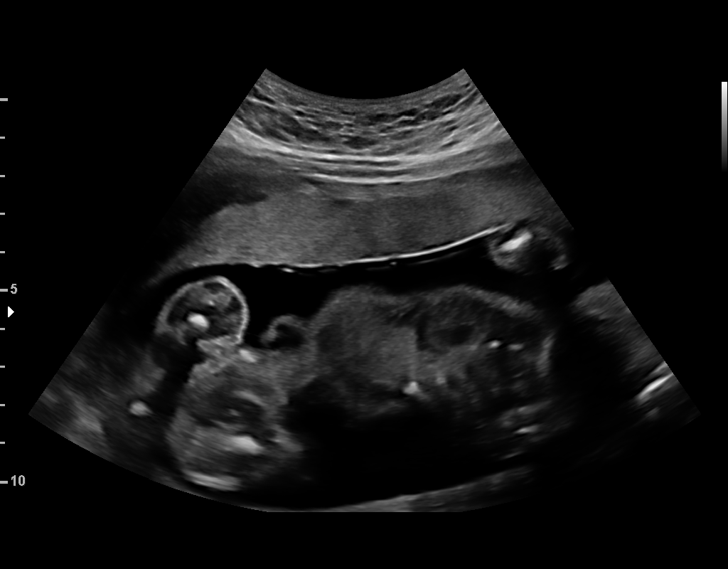
[im 39/50]
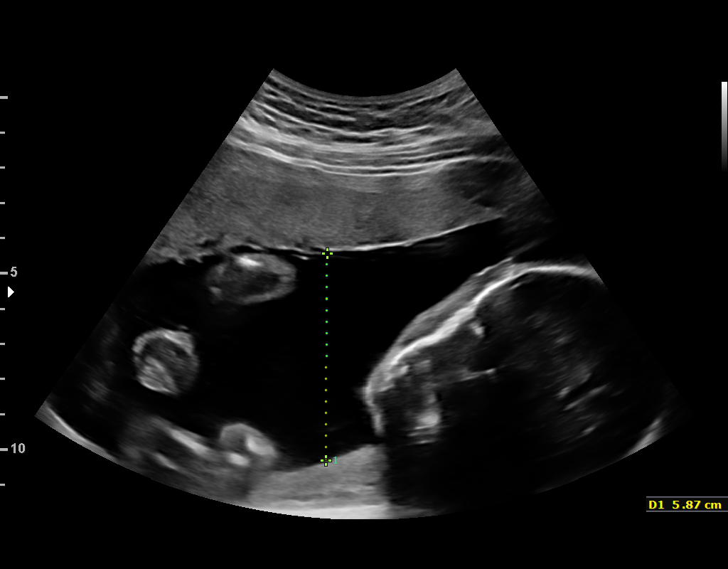
[im 42/50]
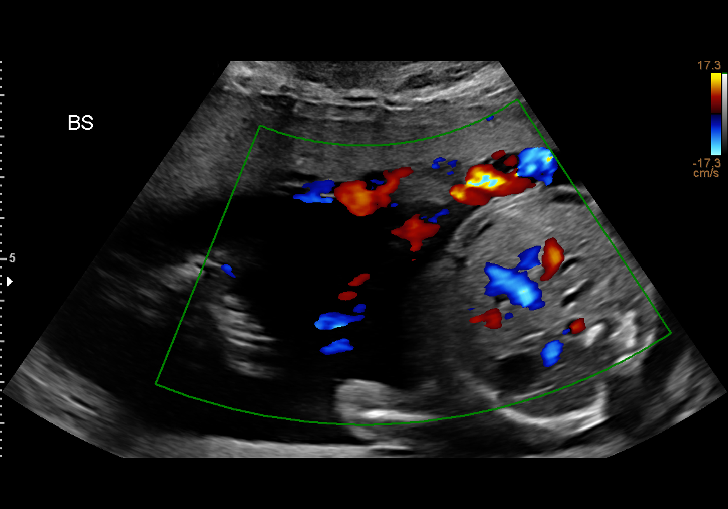
[im 46/50]
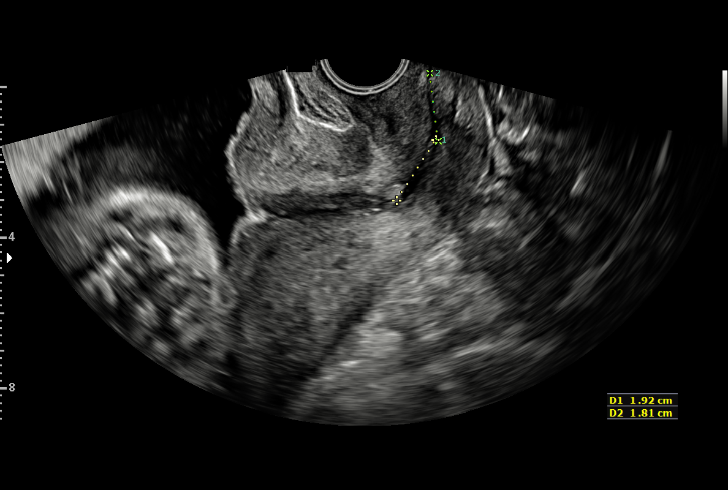
[im 50/50]
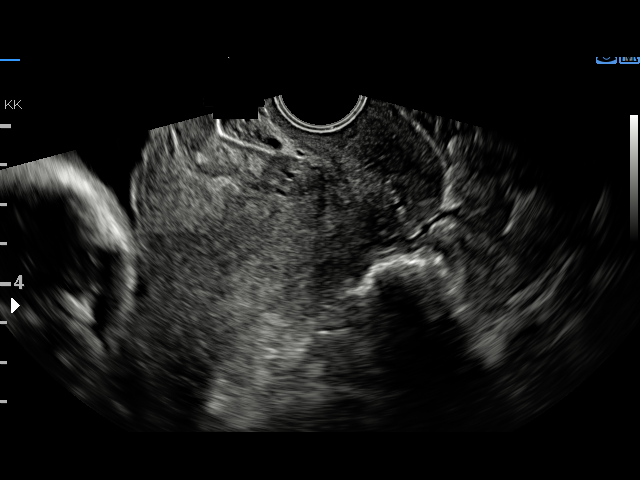

[14 of 28 positions shown; findings below may reference images not displayed]

1  MAGARETTA KEIZE            [id number]      [id number]     [id number]
2  MAGARETTA KEIZE            [id number]      [id number]     [id number]
Indications

23 weeks gestation of pregnancy
Poor obstetric history: Previous midtrimester
loss (21 weeks)
Encounter for cervical length
Abnormal biochemical finding on antenatal
screening of mother (Brayan Noel Manzoni)
OB History

Blood Type:            Height:  5'1"   Weight (lb):  117       BMI:
Gravidity:    3         Term:   0        Prem:   1        SAB:   1
TOP:          0       Ectopic:  0        Living: 0
Fetal Evaluation

Num Of Fetuses:     1
Fetal Heart         148
Rate(bpm):
Cardiac Activity:   Observed
Presentation:       Cephalic
Placenta:           Anterior, above cervical os
P. Cord Insertion:  Previously Visualized

Amniotic Fluid
AFI FV:      Subjectively within normal limits

Largest Pocket(cm)
5.9
Biometry

BPD:      63.8  mm     G. Age:  25w 6d         97  %    CI:        75.01   %    70 - 86
FL/HC:      18.1   %    18.7 -
HC:      233.7  mm     G. Age:  25w 3d         89  %    HC/AC:      1.24        1.05 -
AC:      188.3  mm     G. Age:  23w 4d         38  %    FL/BPD:     66.3   %    71 - 87
FL:       42.3  mm     G. Age:  23w 6d         41  %    FL/AC:      22.5   %    20 - 24

Est. FW:     645  gm      1 lb 7 oz     56  %
Gestational Age

LMP:           24w 0d        Date:  01/23/17                 EDD:   10/30/17
U/S Today:     24w 5d                                        EDD:   10/25/17
Best:          23w 5d     Det. By:  Previous Ultrasound      EDD:   11/01/17
(03/06/17)
Anatomy

Cranium:               Appears normal         Aortic Arch:            Previously seen
Cavum:                 Previously seen        Ductal Arch:            Previously seen
Ventricles:            Appears normal         Diaphragm:              Appears normal
Choroid Plexus:        Previously seen        Stomach:                Appears normal, left
sided
Cerebellum:            Appears normal         Abdomen:                Appears normal
Posterior Fossa:       Appears normal         Abdominal Wall:         Appears nml (cord
insert, abd wall)
Nuchal Fold:           Previously seen        Cord Vessels:           Previously seen
Face:                  Appears normal         Kidneys:                Appear normal
(orbits and profile)
Lips:                  Appears normal         Bladder:                Appears normal
Thoracic:              Appears normal         Spine:                  Previously seen
Heart:                 Appears normal         Upper Extremities:      Appears normal
(4CH, axis, and
situs)
RVOT:                  Appears normal         Lower Extremities:      Appears normal
LVOT:                  Appears normal

Other:  Fetus appears to be a male. Heels and LT 5th digit previously
visualized. Technically difficult due to fetal position.
Cervix Uterus Adnexa

Cervix
Length:            3.5  cm.
Measured transvaginally.
Impression

Singleton intrauterine pregnancy at 23 weeks 5 days
gestation with fetal cardiac activity
Cephalic presentation
Anterior placenta
Normal appearing fetal growth and amniotic fluid volume
Cervical length 
3.5 cm
Recommendations

Follow-up ultrasounds as clinically indicated.

## 2019-12-15 ENCOUNTER — Encounter: Payer: Self-pay | Admitting: *Deleted

## 2020-10-01 ENCOUNTER — Ambulatory Visit (INDEPENDENT_AMBULATORY_CARE_PROVIDER_SITE_OTHER): Payer: Medicaid Other

## 2020-10-01 ENCOUNTER — Other Ambulatory Visit: Payer: Self-pay

## 2020-10-01 DIAGNOSIS — Z3490 Encounter for supervision of normal pregnancy, unspecified, unspecified trimester: Secondary | ICD-10-CM

## 2020-10-01 DIAGNOSIS — Z3201 Encounter for pregnancy test, result positive: Secondary | ICD-10-CM | POA: Diagnosis not present

## 2020-10-01 LAB — POCT PREGNANCY, URINE: Preg Test, Ur: POSITIVE — AB

## 2020-10-01 MED ORDER — PROMETHAZINE HCL 25 MG PO TABS: 25.0000 mg | ORAL_TABLET | Freq: Four times a day (QID) | ORAL | 0 refills | Status: DC | PRN

## 2020-10-01 NOTE — Progress Notes (Signed)
Pt dropped of urine for UPT. UPT positive today.  LMP: unknown as periods have been irregular per pt.. Not on contraception.  Had last baby in 2019. Pt states having current nausea and vomiting x 1 week and breast tenderness. Denies any bleeding or clots or abd pain at this time. Rx phenergan 25mg  q 6hrs PRN sent to pharmacy on file per protocol.   ordered for dating. Pt given options for date and times. Pt states would like [place] this week and does not plan keep pregnancy. Pt declines [place] at this time. Pt asking for other options for sooner [place]. Pt given number for [facility] pregnancy care center to call for dating [facility] and to discuss options.  Pt verbalized understanding.   [place], RN BSN  10/01/20.

## 2020-10-02 ENCOUNTER — Inpatient Hospital Stay (HOSPITAL_COMMUNITY): Payer: Medicaid Other

## 2020-10-02 ENCOUNTER — Inpatient Hospital Stay (HOSPITAL_COMMUNITY)
Admission: EM | Admit: 2020-10-02 | Discharge: 2020-10-02 | Disposition: A | Discharge status: Home / Self Care | Payer: Medicaid Other | Attending: Obstetrics and Gynecology | Admitting: Obstetrics and Gynecology

## 2020-10-02 ENCOUNTER — Encounter (HOSPITAL_COMMUNITY): Payer: Self-pay | Admitting: Obstetrics and Gynecology

## 2020-10-02 ENCOUNTER — Other Ambulatory Visit: Payer: Self-pay

## 2020-10-02 DIAGNOSIS — O209 Hemorrhage in early pregnancy, unspecified: Secondary | ICD-10-CM | POA: Diagnosis present

## 2020-10-02 DIAGNOSIS — O208 Other hemorrhage in early pregnancy: Secondary | ICD-10-CM

## 2020-10-02 DIAGNOSIS — O4690 Antepartum hemorrhage, unspecified, unspecified trimester: Secondary | ICD-10-CM

## 2020-10-02 DIAGNOSIS — Z3A01 Less than 8 weeks gestation of pregnancy: Secondary | ICD-10-CM | POA: Insufficient documentation

## 2020-10-02 DIAGNOSIS — O210 Mild hyperemesis gravidarum: Secondary | ICD-10-CM

## 2020-10-02 DIAGNOSIS — O219 Vomiting of pregnancy, unspecified: Secondary | ICD-10-CM | POA: Diagnosis not present

## 2020-10-02 LAB — URINALYSIS, ROUTINE W REFLEX MICROSCOPIC
Bilirubin Urine: NEGATIVE
Glucose, UA: NEGATIVE mg/dL
Hgb urine dipstick: NEGATIVE
Ketones, ur: NEGATIVE mg/dL
Leukocytes,Ua: NEGATIVE
Nitrite: NEGATIVE
Protein, ur: NEGATIVE mg/dL
Specific Gravity, Urine: 1.025 (ref 1.005–1.030)
pH: 6 (ref 5.0–8.0)

## 2020-10-02 LAB — CBC
HCT: 38.3 % (ref 36.0–46.0)
Hemoglobin: 11.9 g/dL — ABNORMAL LOW (ref 12.0–15.0)
MCH: 27.6 pg (ref 26.0–34.0)
MCHC: 31.1 g/dL (ref 30.0–36.0)
MCV: 88.9 fL (ref 80.0–100.0)
Platelets: 311 10*3/uL (ref 150–400)
RBC: 4.31 MIL/uL (ref 3.87–5.11)
RDW: 14.1 % (ref 11.5–15.5)
WBC: 9.2 10*3/uL (ref 4.0–10.5)
nRBC: 0 % (ref 0.0–0.2)

## 2020-10-02 LAB — WET PREP, GENITAL
Clue Cells Wet Prep HPF POC: NONE SEEN
Sperm: NONE SEEN
Trich, Wet Prep: NONE SEEN
Yeast Wet Prep HPF POC: NONE SEEN

## 2020-10-02 LAB — HCG, QUANTITATIVE, PREGNANCY: hCG, Beta Chain, Quant, S: 98233 m[IU]/mL — ABNORMAL HIGH (ref <5)

## 2020-10-02 MED ORDER — METOCLOPRAMIDE HCL 10 MG PO TABS
10.0000 mg | ORAL_TABLET | Freq: Once | ORAL | Status: AC
Start: 2020-10-02 — End: 2020-10-02
  Administered 2020-10-02: 10 mg via ORAL
  Filled 2020-10-02: qty 1

## 2020-10-02 MED ORDER — METOCLOPRAMIDE HCL 10 MG PO TABS: 10.0000 mg | ORAL_TABLET | Freq: Four times a day (QID) | ORAL | 0 refills | Status: DC | PRN

## 2020-10-02 NOTE — MAU Provider Note (Signed)
History     CSN: [id number]  Arrival date and time: 10/02/20 1310   Event Date/Time   First Provider Initiated Contact with Patient 10/02/20 1517      Chief Complaint  Patient presents with  . Vaginal Bleeding  . Abdominal Pain  . Back Pain   24 y.o. [id number] @[redacted]w[redacted]d  by unsure LMP presenting with abd pain, spotting, and N/V. Reports onset of abd pain 2 weeks ago. Describes as constant cramping in center of lower abd. Rates 5/10. Has not tried anything for it. Denies urinary sx. Spotting started today. Mostly sees brown when she wipes. No recent sex. Also reports daily N/V. Cannot tolerate much po.   OB History    Gravida  4   Para  2   Term  1   Preterm  1   AB  1   Living  1     SAB  1   IAB      Ectopic      Multiple  0   Live Births  1           Past Medical History:  Diagnosis Date  . Medical history non-contributory   . PONV (postoperative nausea and vomiting)     Past Surgical History:  Procedure Laterality Date  . NO PAST SURGERIES      Family History  Problem Relation Age of Onset  . Hypertension Maternal Grandmother   . Hypertension Maternal Grandfather   . Healthy Mother   . Healthy Father     Social History   Tobacco Use  . Smoking status: Never Smoker  . Smokeless tobacco: Never Used  Vaping Use  . Vaping Use: Never used  Substance Use Topics  . Alcohol use: No  . Drug use: No    Allergies: No Known Allergies  No medications prior to admission.    Review of Systems  Constitutional: Negative for fever.  Gastrointestinal: Positive for abdominal pain, nausea and vomiting.  Genitourinary: Positive for vaginal bleeding. Negative for dysuria, frequency, urgency and vaginal discharge.  Musculoskeletal: Positive for back pain.   Physical Exam   Blood pressure 104/66, pulse 79, temperature 98.3 F (36.8 C), temperature source Oral, resp. rate 19, height 5\' 1"  (1.549 m), weight 64 kg, SpO2 100 %, unknown if currently  breastfeeding.  Physical Exam Vitals and nursing note reviewed. Exam conducted with a chaperone present.  Constitutional:      General: She is not in acute distress.    Appearance: Normal appearance.  HENT:     Head: Normocephalic.  Cardiovascular:     Rate and Rhythm: Normal rate.  Pulmonary:     Effort: Pulmonary effort is normal. No respiratory distress.  Abdominal:     General: There is no distension.     Palpations: Abdomen is soft. There is no mass.     Tenderness: There is no abdominal tenderness. There is no guarding or rebound.     Hernia: No hernia is present.  Genitourinary:    Comments: External: no lesions or erythema Vagina: rugated, pink, moist, thin white discharge Uterus: non enlarged, anteverted, non tender, no CMT Adnexae: no masses, no tenderness left, no tenderness right Cervix closed  Musculoskeletal:        General: Normal range of motion.     Cervical back: Normal range of motion.  Skin:    General: Skin is warm and dry.  Neurological:     General: No focal deficit present.     Mental Status: She  is alert and oriented to person, place, and time.  Psychiatric:        Mood and Affect: Mood normal.        Behavior: Behavior normal.    Results for orders placed or performed during the hospital encounter of 10/02/20 (from the past 24 hour(s))  CBC     Status: Abnormal   Collection Time: 10/02/20  2:39 PM  Result Value Ref Range   WBC 9.2 4.0 - 10.5 K/uL   RBC 4.31 3.87 - 5.11 MIL/uL   Hemoglobin 11.9 (L) 12.0 - 15.0 g/dL   HCT 82.9 93.7 - 16.9 %   MCV 88.9 80.0 - 100.0 fL   MCH 27.6 26.0 - 34.0 pg   MCHC 31.1 30.0 - 36.0 g/dL   RDW 67.8 93.8 - 10.1 %   Platelets 311 150 - 400 K/uL   nRBC 0.0 0.0 - 0.2 %  hCG, quantitative, pregnancy     Status: Abnormal   Collection Time: 10/02/20  2:39 PM  Result Value Ref Range   hCG, Beta Chain, Quant, S 98,233 (H) <5 mIU/mL  Urinalysis, Routine w reflex microscopic Urine, Clean Catch     Status: None    Collection Time: 10/02/20  3:06 PM  Result Value Ref Range   Color, Urine YELLOW YELLOW   APPearance CLEAR CLEAR   Specific Gravity, Urine 1.025 1.005 - 1.030   pH 6.0 5.0 - 8.0   Glucose, UA NEGATIVE NEGATIVE mg/dL   Hgb urine dipstick NEGATIVE NEGATIVE   Bilirubin Urine NEGATIVE NEGATIVE   Ketones, ur NEGATIVE NEGATIVE mg/dL   Protein, ur NEGATIVE NEGATIVE mg/dL   Nitrite NEGATIVE NEGATIVE   Leukocytes,Ua NEGATIVE NEGATIVE  Wet prep, genital     Status: Abnormal   Collection Time: 10/02/20  3:29 PM   Specimen: Cervix  Result Value Ref Range   Yeast Wet Prep HPF POC NONE SEEN NONE SEEN   Trich, Wet Prep NONE SEEN NONE SEEN   Clue Cells Wet Prep HPF POC NONE SEEN NONE SEEN   WBC, Wet Prep HPF POC MODERATE (A) NONE SEEN   Sperm NONE SEEN    US OB Comp Less 14 Wks  Result Date: 10/02/2020 CLINICAL DATA:  Spotting. EXAM: OBSTETRIC <14 WK [place] AND TRANSVAGINAL OB US TECHNIQUE: Both transabdominal and transvaginal ultrasound examinations were performed for complete evaluation of the gestation as well as the maternal uterus, adnexal regions, and pelvic cul-de-sac. Transvaginal technique was performed to assess early pregnancy. COMPARISON:  Ultrasound 03/06/2017. FINDINGS: Intrauterine gestational sac: Single Yolk sac:  Present Embryo:  Present Cardiac Activity: Present Heart Rate: 137 bpm CRL: 11.2 mm   7 w   1 d                  [place] EDC: 05/20/2021 Subchorionic hemorrhage:  None visualized. Maternal uterus/adnexae: Small right ovarian corpus luteal cyst. No other abnormality. No free fluid. IMPRESSION: Single viable intrauterine pregnancy at 7 weeks 1 day. Electronically Signed   By: Maisie Fus  Register   On: 10/02/2020 15:53   MAU Course  Procedures Reglan  MDM Labs and [place] ordered and reviewed. Viable IUP on [place]. Nausea improved. Tolerating po. Stable for discharge home.  Assessment and Plan   1. [redacted] weeks gestation of pregnancy   2. Vaginal bleeding in pregnancy   3. Morning sickness     Discharge home Follow up at [facility] to start care Rx Reglan SAB precautions  Allergies as of 10/02/2020   No Known Allergies  Medication List    TAKE these medications   metoCLOPramide 10 MG tablet Commonly known as: Reglan Take 1 tablet (10 mg total) by mouth every 6 (six) hours as needed for nausea or vomiting.   promethazine 25 MG tablet Commonly known as: PHENERGAN Take 1 tablet (25 mg total) by mouth every 6 (six) hours as needed for nausea or vomiting.   Vitafol Gummies 3.33-0.333-34.8 MG Chew Chew 1 tablet by mouth daily.      Donette Larry, CNM 10/02/2020, 5:06 PM

## 2020-10-02 NOTE — MAU Note (Signed)
Presents with c/o spotting, lower abdominal and back pain, and N/V.  Reports spotting started yesterday, wearing pantyliner.  States lower abdominal/back pain began 2-3 weeks ago, reports pain is constant.  Reports having N/V, states unable to keep anything down. Unsure LMP, mid December, reports irregular cycles

## 2020-10-02 NOTE — ED Triage Notes (Signed)
Emergency Medicine Provider OB Triage Evaluation Note  Mackenzie Lambert is a 25 y.o. female, [phone], at Unknown gestation who presents to the emergency department with complaints of pregnancy and spotting  Review of  Systems  Positive: abd cramps and spotting Negative: fever or cough  Physical Exam  BP 119/67 (BP Location: Left Arm)   Pulse 87   Temp 99.1 F (37.3 C) (Oral)   Resp 16   LMP  (LMP Unknown)   SpO2 100%  General: Awake, no distress  HEENT: Atraumatic  Resp: Normal effort  Cardiac: Normal rate Abd: Nondistended, nontender  MSK: Moves all extremities without difficulty Neuro: Speech clear  Medical Decision Making  Pt evaluated for pregnancy concern and is stable for transfer to MAU. Pt is in agreement with plan for transfer.  2:22 PM Discussed with MAU APP, Nona , who accepts patient in transfer.  Clinical Impression  No diagnosis found.     Elson Areas, [place] 10/02/20 1423

## 2020-10-03 LAB — GC/CHLAMYDIA PROBE AMP (~~LOC~~) NOT AT ARMC
Chlamydia: POSITIVE — AB
Comment: NEGATIVE
Comment: NORMAL
Neisseria Gonorrhea: NEGATIVE

## 2020-10-11 ENCOUNTER — Other Ambulatory Visit: Payer: Self-pay | Admitting: Certified Nurse Midwife

## 2020-10-11 ENCOUNTER — Telehealth: Payer: Self-pay | Admitting: Certified Nurse Midwife

## 2020-10-11 DIAGNOSIS — A749 Chlamydial infection, unspecified: Secondary | ICD-10-CM

## 2020-10-11 DIAGNOSIS — O98819 Other maternal infectious and parasitic diseases complicating pregnancy, unspecified trimester: Secondary | ICD-10-CM | POA: Insufficient documentation

## 2020-10-11 MED ORDER — AZITHROMYCIN 500 MG PO TABS: 1000.0000 mg | ORAL_TABLET | Freq: Once | ORAL | 0 refills | Status: AC

## 2020-10-11 NOTE — Telephone Encounter (Signed)
+  Chlamydia, no answer, LMTCB.

## 2020-10-11 NOTE — Progress Notes (Signed)
+  Chlamydia, Rx sent, MyChart message sent

## 2020-10-11 NOTE — Discharge Instructions (Signed)
Morning Sickness  Morning sickness is when you feel like you may vomit (feel nauseous) during pregnancy. Sometimes, you may vomit. Morning sickness most often happens in the morning, but it can also happen at any time of the day. Some women may have morning sickness that makes them vomit all the time. This is a more serious problem that needs treatment. What are the causes? The cause of this condition is not known. What increases the risk?  You had vomiting or a feeling like you may vomit before your pregnancy.  You had morning sickness in another pregnancy.  You are pregnant with more than one baby, such as twins. What are the signs or symptoms?  Feeling like you may vomit.  Vomiting. How is this treated? Treatment is usually not needed for this condition. You may only need to change what you eat. In some cases, your doctor may give you some things to take for your condition. These include:  Vitamin B6 supplements.  Medicines to treat the feeling that you may vomit.  Ginger. Follow these instructions at home: Medicines  Take over-the-counter and prescription medicines only as told by your doctor. Do not take any medicines until you talk with your doctor about them first.  Take multivitamins before you get pregnant. These can stop or lessen the symptoms of morning sickness. Eating and drinking  Eat dry toast or crackers before getting out of bed.  Eat 5 or 6 small meals a day.  Eat dry and bland foods like rice and baked potatoes.  Do not eat greasy, fatty, or spicy foods.  Have someone cook for you if the smell of food causes you to vomit or to feel like you may vomit.  If you feel like you may vomit after taking prenatal vitamins, take them at night or with a snack.  Eat protein foods when you need a snack. Nuts, yogurt, and cheese are good choices.  Drink fluids throughout the day.  Try ginger ale made with real ginger, ginger tea made from fresh grated ginger, or  ginger candies. General instructions  Do not smoke or use any products that contain nicotine or tobacco. If you need help quitting, ask your doctor.  Use an air purifier to keep the air in your house free of smells.  Get lots of fresh air.  Try to avoid smells that make you feel sick.  Try wearing an acupressure wristband. This is a wristband that is used to treat seasickness.  Try a treatment called acupuncture. In this treatment, a doctor puts needles into certain areas of your body to make you feel better. Contact a doctor if:  You need medicine to feel better.  You feel dizzy or light-headed.  You are losing weight. Get help right away if:  The feeling that you may vomit will not go away, or you cannot stop vomiting.  You faint.  You have very bad pain in your belly. Summary  Morning sickness is when you feel like you may vomit (feel nauseous) during pregnancy.  You may feel sick in the morning, but you can feel this way at any time of the day.  Making some changes to what you eat may help your symptoms go away. This information is not intended to replace advice given to you by your health care provider. Make sure you discuss any questions you have with your health care provider. Document Revised: 04/02/2020 Document Reviewed: 03/12/2020 Elsevier Patient Education  2021 [employer] Inc.   Abdominal Pain  During Pregnancy Belly (abdominal) pain is common during pregnancy. There are many possible causes. Some causes are more serious than others. Sometimes the cause is not known. Always tell your doctor if you have belly pain. Follow these instructions at home:  Do not have sex or put anything in your vagina until your pain goes away completely.  Get plenty of rest until your pain gets better.  Drink enough fluid to keep your pee (urine) pale yellow.  Take over-the-counter and prescription medicines only as told by your doctor.  Keep all follow-up visits.   Contact  a doctor if:  You keep having pain after resting.  Your pain gets worse after resting.  You have lower belly pain that: ? Comes and goes at regular times. ? Spreads to your back. ? Feels like menstrual cramps.  You have pain or burning when you pee (urinate). Get help right away if:  You have a fever or chills.  You feel like it is hard to breathe.  You have bleeding from your vagina.  You are leaking fluid or tissue from your vagina.  You vomit for more than 24 hours.  You have watery poop (diarrhea) for more than 24 hours.  Your baby is moving less than usual.  You feel very weak or faint.  You have very bad pain in your upper belly. Summary  Belly pain is common during pregnancy. There are many possible causes.  If you have belly pain during pregnancy, tell your doctor right away.  Keep all follow-up visits. This information is not intended to replace advice given to you by your health care provider. Make sure you discuss any questions you have with your health care provider. Document Revised: 05/01/2020 Document Reviewed: 05/01/2020 Elsevier Patient Education  2021 [employer].

## 2020-10-18 ENCOUNTER — Inpatient Hospital Stay (HOSPITAL_COMMUNITY)
Admission: AD | Admit: 2020-10-18 | Discharge: 2020-10-18 | Disposition: A | Discharge status: Home / Self Care | Payer: Medicaid Other | Attending: Obstetrics and Gynecology | Admitting: Obstetrics and Gynecology

## 2020-10-18 ENCOUNTER — Other Ambulatory Visit: Payer: Self-pay

## 2020-10-18 DIAGNOSIS — O98819 Other maternal infectious and parasitic diseases complicating pregnancy, unspecified trimester: Secondary | ICD-10-CM | POA: Diagnosis not present

## 2020-10-18 DIAGNOSIS — A749 Chlamydial infection, unspecified: Secondary | ICD-10-CM | POA: Diagnosis not present

## 2020-10-18 DIAGNOSIS — Z3A00 Weeks of gestation of pregnancy not specified: Secondary | ICD-10-CM

## 2020-10-18 NOTE — Discharge Instructions (Signed)
Chlamydia Test Why am I having this test? Chlamydia is a common STI (sexually transmitted infection), especially in people younger than 24 years of age. Chlamydia may be associated with other STIs, such as gonorrhea. Your health care provider may test you for chlamydia if you:  Are sexually active.  Have another STI.  Have possible symptoms of chlamydia infection, such as pelvic pain or vaginal discharge. What is being tested? This test checks for the presence of the bacteria Chlamydia trachomatis, which causes chlamydia infection. What kind of sample is taken? Depending on your symptoms, your health care provider may collect one of the following samples:  A urine sample. This is collected in a germ-free (sterile) container that is given to you by your health care provider or the lab where the urine will be examined and tested.  A fluid sample. This may be collected by swabbing one of the following: ? The vagina or the lower part of the womb (cervix). ? The part of your body that drains urine from your bladder (urethra). ? Your eye. ? The upper part of your throat behind the nose (nasopharynx). ? The rectum.      How do I prepare for this test?  Do not urinate for an hour before the test.  Try to arrive with a full bladder.  Do not use vaginal creams or douches before the test. Tell a health care provider about:  All medicines you are taking, including vitamins, herbs, eye drops, creams, and over-the-counter medicines.  Any surgeries you have had.  Any medical conditions you have.  Whether you are pregnant or may be pregnant. How are the results reported? Your test results will be reported as either positive or negative for chlamydia. What do the results mean? A positive result means that you have a chlamydia infection. A negative result is normal and means that you do not have a chlamydia infection. Talk with your health care provider about what your results  mean. Questions to ask your health care provider Ask your health care provider, or the department that is doing the test:  When will my results be ready?  How will I get my results?  What are my treatment options?  What other tests do I need?  What are my next steps? Summary  This test may be done to see if you have chlamydia, which is a common STI (sexually transmitted infection).  Depending on your symptoms, your health care provider may collect samples of urine or fluid. The fluid sample is collected by swabbing your vagina, cervix, urethra, eye, nasopharynx, or rectum.  Your test results will be reported as either positive or negative for chlamydia. This information is not intended to replace advice given to you by your health care provider. Make sure you discuss any questions you have with your health care provider. Document Revised: 06/28/2020 Document Reviewed: 06/28/2020 Elsevier Patient Education  2021 [employer].

## 2020-10-18 NOTE — MAU Provider Note (Signed)
Event Date/Time   First Provider Initiated Contact with Patient 10/18/20 1605      S [name] is a 25 y.o. [phone] patient who presents to MAU today to discuss her recent Chlamydia dagnosis. Patient states she has been with two partners in the past 3 years and both have tested negative. She is concerned that the Hologic Aptima is an inaccurate screening tool and is requesting a quantitative Chlamydia test.   She denies all complaints.   O LMP  (LMP Unknown)    Physical Exam Constitutional:      Appearance: Normal appearance.  Pulmonary:     Effort: Pulmonary effort is normal.  Skin:    Capillary Refill: Capillary refill takes less than 2 seconds.  Neurological:     Mental Status: She is alert and oriented to person, place, and time.  Psychiatric:        Mood and Affect: Mood normal.        Behavior: Behavior normal.        Thought Content: Thought content normal.        Judgment: Judgment normal.    A Medical screening exam complete Patient presented to MAU lobby and asked MAU Registration Staff to call Provider to answer her questions. CNM attempted informal reassurance. Patient continued to have questions after 20 minutes of discussion with CNM. She was then asked to return to lobby and register as a formal MAU patient. Her name did not appear on the MAU census after ten minutes. Patient was found seated in lobby and again asked to sign in to registration. Once her name appeared on MAU census she was escorted by CNM to family report room and asked "can you just explain it all to me again". Subsequent 20 minutes of discussion at the end of which patient continued to verbalize concern about the accuracy of MAU Hologic swab --Patient advised to present with her partner to [facility] for simultaneous testing and potential diagnosis.  Total discussion time with CNM: 45 minutes  P Discharge from MAU in stable condition Warning signs for worsening condition that would  warrant emergency follow-up discussed Patient may return to MAU as needed   Clayton Bibles, [place] 10/18/2020 4:50 PM

## 2020-11-06 ENCOUNTER — Ambulatory Visit (HOSPITAL_COMMUNITY)
Admission: EM | Admit: 2020-11-06 | Discharge: 2020-11-06 | Disposition: A | Discharge status: Other Acute Inpatient Hospital | Payer: Medicaid Other

## 2020-11-06 ENCOUNTER — Encounter (HOSPITAL_COMMUNITY): Payer: Self-pay | Admitting: *Deleted

## 2020-11-06 ENCOUNTER — Emergency Department (HOSPITAL_COMMUNITY): Payer: Medicaid Other

## 2020-11-06 ENCOUNTER — Emergency Department (HOSPITAL_COMMUNITY)
Admission: EM | Admit: 2020-11-06 | Discharge: 2020-11-06 | Disposition: A | Discharge status: Home / Self Care | Payer: Medicaid Other | Attending: Emergency Medicine | Admitting: Emergency Medicine

## 2020-11-06 ENCOUNTER — Other Ambulatory Visit: Payer: Self-pay

## 2020-11-06 DIAGNOSIS — S39012A Strain of muscle, fascia and tendon of lower back, initial encounter: Secondary | ICD-10-CM | POA: Diagnosis not present

## 2020-11-06 DIAGNOSIS — Y92410 Unspecified street and highway as the place of occurrence of the external cause: Secondary | ICD-10-CM | POA: Diagnosis not present

## 2020-11-06 DIAGNOSIS — S3992XA Unspecified injury of lower back, initial encounter: Secondary | ICD-10-CM | POA: Diagnosis present

## 2020-11-06 DIAGNOSIS — S20212A Contusion of left front wall of thorax, initial encounter: Secondary | ICD-10-CM | POA: Diagnosis not present

## 2020-11-06 MED ORDER — METHOCARBAMOL 500 MG PO TABS: 1000.0000 mg | ORAL_TABLET | Freq: Four times a day (QID) | ORAL | 0 refills | Status: AC

## 2020-11-06 MED ORDER — IBUPROFEN 800 MG PO TABS
800.0000 mg | ORAL_TABLET | Freq: Once | ORAL | Status: AC
  Administered 2020-11-06: 800 mg via ORAL
  Filled 2020-11-06: qty 1

## 2020-11-06 MED ORDER — NAPROXEN 500 MG PO TABS: 500.0000 mg | ORAL_TABLET | Freq: Two times a day (BID) | ORAL | 0 refills | Status: DC
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
## 2020-11-06 NOTE — ED Notes (Signed)
Patient verbalizes understanding of discharge instructions. Opportunity for questioning and answers were provided. Armband removed by staff, pt discharged from ED.  
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
## 2020-11-10 ENCOUNTER — Emergency Department (HOSPITAL_COMMUNITY)
Admission: EM | Admit: 2020-11-10 | Discharge: 2020-11-10 | Disposition: A | Discharge status: Home / Self Care | Payer: Medicaid Other | Attending: Emergency Medicine | Admitting: Emergency Medicine

## 2020-11-10 ENCOUNTER — Encounter (HOSPITAL_COMMUNITY): Payer: Self-pay | Admitting: Emergency Medicine

## 2020-11-10 ENCOUNTER — Other Ambulatory Visit: Payer: Self-pay

## 2020-11-10 DIAGNOSIS — S20212D Contusion of left front wall of thorax, subsequent encounter: Secondary | ICD-10-CM

## 2020-11-10 DIAGNOSIS — S299XXD Unspecified injury of thorax, subsequent encounter: Secondary | ICD-10-CM | POA: Diagnosis present

## 2020-11-10 MED ORDER — IBUPROFEN 400 MG PO TABS
600.0000 mg | ORAL_TABLET | Freq: Once | ORAL | Status: AC
  Administered 2020-11-10: 600 mg via ORAL
  Filled 2020-11-10: qty 1

## 2020-11-10 MED ORDER — IBUPROFEN 600 MG PO TABS: 600.0000 mg | ORAL_TABLET | Freq: Four times a day (QID) | ORAL | 0 refills | Status: AC | PRN

## 2020-11-10 MED ORDER — ACETAMINOPHEN ER 650 MG PO TBCR: 650.0000 mg | EXTENDED_RELEASE_TABLET | Freq: Three times a day (TID) | ORAL | 0 refills | Status: AC

## 2020-11-10 MED ORDER — HYDROCODONE-ACETAMINOPHEN 5-325 MG PO TABS
1.0000 | ORAL_TABLET | Freq: Once | ORAL | Status: AC
Start: 2020-11-10 — End: 2020-11-10
  Administered 2020-11-10: 1 via ORAL
  Filled 2020-11-10: qty 1
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
## 2020-11-10 NOTE — ED Notes (Signed)
ED Provider at bedside. 
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
## 2020-12-24 ENCOUNTER — Encounter (HOSPITAL_COMMUNITY): Payer: Self-pay | Admitting: Emergency Medicine

## 2020-12-24 ENCOUNTER — Emergency Department (HOSPITAL_COMMUNITY): Payer: Medicaid Other

## 2020-12-24 ENCOUNTER — Emergency Department (HOSPITAL_COMMUNITY)
Admission: EM | Admit: 2020-12-24 | Discharge: 2020-12-25 | Disposition: A | Discharge status: Home / Self Care | Payer: Medicaid Other | Attending: Emergency Medicine | Admitting: Emergency Medicine

## 2020-12-24 DIAGNOSIS — Z5321 Procedure and treatment not carried out due to patient leaving prior to being seen by health care provider: Secondary | ICD-10-CM | POA: Insufficient documentation

## 2020-12-24 DIAGNOSIS — R079 Chest pain, unspecified: Secondary | ICD-10-CM | POA: Insufficient documentation

## 2020-12-24 DIAGNOSIS — M549 Dorsalgia, unspecified: Secondary | ICD-10-CM | POA: Diagnosis not present

## 2020-12-24 LAB — I-STAT BETA HCG BLOOD, ED (MC, WL, AP ONLY): I-stat hCG, quantitative: <5 m[IU]/mL (ref <5)

## 2020-12-24 NOTE — ED Notes (Signed)
Pt called 1x

## 2020-12-24 NOTE — ED Notes (Signed)
Pt called 3x no answer  
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
## 2020-12-25 NOTE — ED Notes (Signed)
Pt asked tech the wait time tech explained multiple times there wasn't one and that we would get her back as soon as we could. Pt is [employer] employee with badge on, pt was seen walking on the back hallway towards [place] 2 hours prior to this note. Pt has not been seen since. Pt was called multiple times and checked outside for pt and was no where to be seen

## 2021-01-02 ENCOUNTER — Other Ambulatory Visit: Payer: Self-pay

## 2021-01-02 ENCOUNTER — Ambulatory Visit
Admission: EM | Admit: 2021-01-02 | Discharge: 2021-01-02 | Disposition: A | Discharge status: Home / Self Care | Payer: Medicaid Other | Attending: Family Medicine | Admitting: Family Medicine

## 2021-01-02 DIAGNOSIS — H1033 Unspecified acute conjunctivitis, bilateral: Secondary | ICD-10-CM

## 2021-01-02 MED ORDER — POLYMYXIN B-TRIMETHOPRIM 10000-0.1 UNIT/ML-% OP SOLN: 1.0000 [drp] | Freq: Four times a day (QID) | OPHTHALMIC | 0 refills | Status: AC

## 2021-01-02 NOTE — ED Provider Notes (Signed)
[facility]    CSN: [id number] Arrival date & time: 01/02/21  1312      History   Chief Complaint Chief Complaint  Patient presents with  . Eye Drainage    HPI Mackenzie Lambert is a 25 y.o. female.   Here today with 2-day history of red, itchy, crusted eyes.  Denies visual changes, congestion, sore throat, cough, fever, chills, headache.  Son sick with similar symptoms.  No known history of pertinent chronic medical problems.     Past Medical History:  Diagnosis Date  . Medical history non-contributory   . PONV (postoperative nausea and vomiting)     Patient Active Problem List   Diagnosis Date Noted  . Chlamydia infection affecting pregnancy 10/11/2020  . Normal labor 10/12/2017  . Supervision of high risk pregnancy, antepartum 04/22/2017  . Prior perinatal loss in second trimester, antepartum 04/22/2017    Past Surgical History:  Procedure Laterality Date  . NO PAST SURGERIES      OB History    Gravida  4   Para  2   Term  1   Preterm  1   AB  1   Living  1     SAB  1   IAB      Ectopic      Multiple  0   Live Births  1            Home Medications    Prior to Admission medications   Medication Sig Start Date End Date Taking? Authorizing Provider  trimethoprim-polymyxin b (POLYTRIM) ophthalmic solution Place 1 drop into both eyes every 6 (six) hours. 01/02/21  Yes Particia Nearing, PA-C  acetaminophen (TYLENOL 8 HOUR) 650 MG CR tablet Take 1 tablet (650 mg total) by mouth every 8 (eight) hours. 11/10/20   Derwood Kaplan, MD  ibuprofen (ADVIL) 600 MG tablet Take 1 tablet (600 mg total) by mouth every 6 (six) hours as needed. 11/10/20   Derwood Kaplan, MD  methocarbamol (ROBAXIN) 500 MG tablet Take 2 tablets (1,000 mg total) by mouth 4 (four) times daily. 11/06/20   Renne Crigler, PA-C  metoCLOPramide (REGLAN) 10 MG tablet Take 1 tablet (10 mg total) by mouth every 6 (six) hours as needed for nausea or vomiting. 10/02/20  11/06/20  Donette Larry, CNM  promethazine (PHENERGAN) 25 MG tablet Take 1 tablet (25 mg total) by mouth every 6 (six) hours as needed for nausea or vomiting. 10/01/20 11/06/20  [place] Bing, MD    Family History Family History  Problem Relation Age of Onset  . Hypertension Maternal Grandmother   . Hypertension Maternal Grandfather   . Healthy Mother   . Healthy Father     Social History Social History   Tobacco Use  . Smoking status: Never Smoker  . Smokeless tobacco: Never Used  Vaping Use  . Vaping Use: Never used  Substance Use Topics  . Alcohol use: No  . Drug use: No     Allergies   Patient has no known allergies.   Review of Systems Review of Systems Per HPI Physical Exam Triage Vital Signs ED Triage Vitals  Enc Vitals Group     BP 01/02/21 1419 105/65     Pulse Rate 01/02/21 1419 99     Resp 01/02/21 1419 18     Temp 01/02/21 1419 98.3 F (36.8 C)     Temp Source 01/02/21 1419 Oral     SpO2 01/02/21 1419 99 %  Weight --      Height --      Head Circumference --      Peak Flow --      Pain Score 01/02/21 1420 0     Pain Loc --      Pain Edu? --      Excl. in GC? --    No data found.  Updated Vital Signs BP 105/65 (BP Location: Left Arm)   Pulse 99   Temp 98.3 F (36.8 C) (Oral)   Resp 18   LMP 12/23/2020   SpO2 99%   Breastfeeding No   Visual Acuity Right Eye Distance:   Left Eye Distance:   Bilateral Distance:    Right Eye Near:   Left Eye Near:    Bilateral Near:     Physical Exam Vitals and nursing note reviewed.  Constitutional:      Appearance: Normal appearance. She is not ill-appearing.  HENT:     Head: Atraumatic.     Mouth/Throat:     Mouth: Mucous membranes are moist.     Pharynx: Oropharynx is clear.  Eyes:     Extraocular Movements: Extraocular movements intact.     Comments: Bilateral conjunctive mildly erythematous, crusting eyelashes  Cardiovascular:     Rate and Rhythm: Normal rate and regular rhythm.      Heart sounds: Normal heart sounds.  Pulmonary:     Effort: Pulmonary effort is normal.     Breath sounds: Normal breath sounds.  Musculoskeletal:        General: Normal range of motion.     Cervical back: Normal range of motion and neck supple.  Skin:    General: Skin is warm and dry.  Neurological:     Mental Status: She is alert and oriented to person, place, and time.  Psychiatric:        Mood and Affect: Mood normal.        Thought Content: Thought content normal.        Judgment: Judgment normal.      UC Treatments / Results  Labs (all labs ordered are listed, but only abnormal results are displayed) Labs Reviewed - No data to display  EKG   Radiology No results found.  Procedures Procedures (including critical care time)  Medications Ordered in UC Medications - No data to display  Initial Impression / Assessment and Plan / UC Course  I have reviewed the triage vital signs and the nursing notes.  Pertinent labs & imaging results that were available during my care of the patient were reviewed by me and considered in my medical decision making (see chart for details).     Will treat with Polytrim drops, warm compresses, over-the-counter pain relievers if needed.  Isolation discussed until resolved.  Final Clinical Impressions(s) / UC Diagnoses   Final diagnoses:  Acute conjunctivitis of both eyes, unspecified acute conjunctivitis type   Discharge Instructions   None    ED Prescriptions    Medication Sig Dispense Auth. Provider   trimethoprim-polymyxin b (POLYTRIM) ophthalmic solution Place 1 drop into both eyes every 6 (six) hours. 10 mL Particia Nearing, [place]     PDMP not reviewed this encounter.   Particia Nearing, [place] 01/02/21 1539

## 2021-01-02 NOTE — ED Triage Notes (Signed)
Pt c/o bilateral eye redness, irritation, drainage and crusty since yesterday.

## 2021-08-20 ENCOUNTER — Emergency Department (HOSPITAL_COMMUNITY)
Admission: EM | Admit: 2021-08-20 | Discharge: 2021-08-20 | Disposition: A | Discharge status: Home / Self Care | Payer: Medicaid Other | Attending: Emergency Medicine | Admitting: Emergency Medicine

## 2021-08-20 DIAGNOSIS — R059 Cough, unspecified: Secondary | ICD-10-CM | POA: Insufficient documentation

## 2021-08-20 DIAGNOSIS — Z20822 Contact with and (suspected) exposure to covid-19: Secondary | ICD-10-CM | POA: Insufficient documentation

## 2021-08-20 DIAGNOSIS — R0981 Nasal congestion: Secondary | ICD-10-CM | POA: Diagnosis not present

## 2021-08-20 DIAGNOSIS — R509 Fever, unspecified: Secondary | ICD-10-CM | POA: Insufficient documentation

## 2021-08-20 DIAGNOSIS — R519 Headache, unspecified: Secondary | ICD-10-CM | POA: Diagnosis not present

## 2021-08-20 DIAGNOSIS — J111 Influenza due to unidentified influenza virus with other respiratory manifestations: Secondary | ICD-10-CM

## 2021-08-20 LAB — RESP PANEL BY RT-PCR (FLU A&B, COVID) ARPGX2
Influenza A by PCR: NEGATIVE
Influenza B by PCR: NEGATIVE
SARS Coronavirus 2 by RT PCR: NEGATIVE

## 2021-08-20 NOTE — ED Triage Notes (Signed)
Pt w flu-like symptoms x4 days, c/o congestion, fever. Advises family flu+, has flu vaccine.

## 2021-08-20 NOTE — Discharge Instructions (Signed)
Please read and follow all provided instructions.  Your diagnoses today include:  1. Influenza-like illness     Tests performed today include: COVID/flu test: pending results Vital signs. See below for your results today.   Medications prescribed:  You may use over-the-counter medications such as Afrin (oxymetazoline), ibuprofen, Sudafed for your symptoms.  Take any prescribed medications only as directed.  Home care instructions:  Follow any educational materials contained in this packet. Please continue drinking plenty of fluids. Use over-the-counter cold and flu medications as needed as directed on packaging for symptom relief. You may also use ibuprofen or tylenol as directed on packaging for pain or fever.   BE VERY CAREFUL not to take multiple medicines containing Tylenol (also called acetaminophen). Doing so can lead to an overdose which can damage your liver and cause liver failure and possibly death.   Follow-up instructions: Please follow-up with your primary care provider in the next 3 days for further evaluation of your symptoms.   Return instructions:  Please return to the Emergency Department if you experience worsening symptoms. Please return if you have a high fever greater than 101 degrees not controlled with over-the-counter medications, persistent vomiting and cannot keep down fluids, or worsening trouble breathing. Please return if you have any other emergent concerns.  Additional Information:  Your vital signs today were: BP 123/77 (BP Location: Left Arm)    Pulse 88    Temp 99.2 F (37.3 C) (Oral)    Resp 18    SpO2 100%  If your blood pressure (BP) was elevated above 135/85 this visit, please have this repeated by your doctor within one month.

## 2021-08-20 NOTE — ED Provider Notes (Signed)
[facility] EMERGENCY DEPARTMENT Provider Note   CSN: [id number] Arrival date & time: 08/20/21  1448     History Chief Complaint  Patient presents with   flu-like symptoms    Mackenzie Lambert is a 25 y.o. female.  Patient with no significant past medical history presents the emergency department for flulike symptoms.  Today is day 3 of illness.  She has had congestion, intermittent fevers.  Fevers now improved.  She has had cough and nasal congestion.  She reports unable to breathe through her nose last night despite saline and ibuprofen.  She was exposed to family who tested positive for flu.  She has had her vaccine.  She has headache and some body aches as well.  No ear pain, vomiting.  Onset of symptoms acute.  Course is constant.  Nothing make symptoms better or worse.      Past Medical History:  Diagnosis Date   Medical history non-contributory    PONV (postoperative nausea and vomiting)     Patient Active Problem List   Diagnosis Date Noted   Chlamydia infection affecting pregnancy 10/11/2020   Normal labor 10/12/2017   Supervision of high risk pregnancy, antepartum 04/22/2017   Prior perinatal loss in second trimester, antepartum 04/22/2017    Past Surgical History:  Procedure Laterality Date   NO PAST SURGERIES       OB History     Gravida  4   Para  2   Term  1   Preterm  1   AB  1   Living  1      SAB  1   IAB      Ectopic      Multiple  0   Live Births  1           Family History  Problem Relation Age of Onset   Hypertension Maternal Grandmother    Hypertension Maternal Grandfather    Healthy Mother    Healthy Father     Social History   Tobacco Use   Smoking status: Never   Smokeless tobacco: Never  Vaping Use   Vaping Use: Never used  Substance Use Topics   Alcohol use: No   Drug use: No    Home Medications Prior to Admission medications   Medication Sig Start Date End Date Taking?  Authorizing Provider  acetaminophen (TYLENOL 8 HOUR) 650 MG CR tablet Take 1 tablet (650 mg total) by mouth every 8 (eight) hours. 11/10/20   Derwood Kaplan, MD  ibuprofen (ADVIL) 600 MG tablet Take 1 tablet (600 mg total) by mouth every 6 (six) hours as needed. 11/10/20   Derwood Kaplan, MD  methocarbamol (ROBAXIN) 500 MG tablet Take 2 tablets (1,000 mg total) by mouth 4 (four) times daily. 11/06/20   Renne Crigler, PA-C  trimethoprim-polymyxin b (POLYTRIM) ophthalmic solution Place 1 drop into both eyes every 6 (six) hours. 01/02/21   Particia Nearing, PA-C  metoCLOPramide (REGLAN) 10 MG tablet Take 1 tablet (10 mg total) by mouth every 6 (six) hours as needed for nausea or vomiting. 10/02/20 11/06/20  Donette Larry, CNM  promethazine (PHENERGAN) 25 MG tablet Take 1 tablet (25 mg total) by mouth every 6 (six) hours as needed for nausea or vomiting. 10/01/20 11/06/20  [place] Bing, MD    Allergies    Patient has no known allergies.  Review of Systems   Review of Systems  Constitutional:  Positive for chills, fatigue and fever.  HENT:  Positive  for congestion and rhinorrhea. Negative for ear pain, sinus pressure and sore throat.   Eyes:  Negative for redness.  Respiratory:  Positive for cough. Negative for wheezing.   Gastrointestinal:  Negative for abdominal pain, diarrhea, nausea and vomiting.  Genitourinary:  Negative for dysuria.  Musculoskeletal:  Positive for myalgias. Negative for neck stiffness.  Skin:  Negative for rash.  Neurological:  Positive for headaches.  Hematological:  Negative for adenopathy.   Physical Exam Updated Vital Signs BP 123/77 (BP Location: Left Arm)    Pulse 88    Temp 99.2 F (37.3 C) (Oral)    Resp 18    SpO2 100%   Physical Exam Vitals and nursing note reviewed.  Constitutional:      Appearance: She is well-developed.  HENT:     Head: Normocephalic and atraumatic.     Jaw: No trismus.     Right Ear: Tympanic membrane, ear canal and external ear  normal.     Left Ear: Tympanic membrane, ear canal and external ear normal.     Nose: Congestion present. No mucosal edema or rhinorrhea.     Mouth/Throat:     Mouth: Mucous membranes are moist. Mucous membranes are not dry. No oral lesions.     Pharynx: Uvula midline. No oropharyngeal exudate, posterior oropharyngeal erythema or uvula swelling.     Tonsils: No tonsillar abscesses.  Eyes:     General:        Right eye: No discharge.        Left eye: No discharge.     Conjunctiva/sclera: Conjunctivae normal.  Cardiovascular:     Rate and Rhythm: Normal rate and regular rhythm.     Heart sounds: Normal heart sounds.  Pulmonary:     Effort: Pulmonary effort is normal. No respiratory distress.     Breath sounds: Normal breath sounds. No wheezing or rales.  Abdominal:     Palpations: Abdomen is soft.     Tenderness: There is no abdominal tenderness.  Musculoskeletal:     Cervical back: Normal range of motion and neck supple.  Lymphadenopathy:     Cervical: No cervical adenopathy.  Skin:    General: Skin is warm and dry.  Neurological:     Mental Status: She is alert.  Psychiatric:        Mood and Affect: Mood normal.    ED Results / Procedures / Treatments   Labs (all labs ordered are listed, but only abnormal results are displayed) Labs Reviewed  RESP PANEL BY RT-PCR (FLU A&B, COVID) [id number]    EKG None  Radiology No results found.  Procedures Procedures   Medications Ordered in ED Medications - No data to display  ED Course  I have reviewed the triage vital signs and the nursing notes.  Pertinent labs & imaging results that were available during my care of the patient were reviewed by me and considered in my medical decision making (see chart for details).  Patient seen and examined. Plan discussed with patient.   Labs: COVID/flu testing  Imaging: None  Medications/Fluids: None  Vital signs reviewed and are as follows: BP 123/77 (BP Location: Left Arm)     Pulse 88    Temp 99.2 F (37.3 C) (Oral)    Resp 18    SpO2 100%   Initial impression: Flulike illness  Patient will check her results in MyChart.  We discussed OTC medications for symptom control.  Urged to see PCP if symptoms persist for more than 3  days. Discussed s/s to return including worsening symptoms, persistent fever, persistent vomiting, or if they have any other concerns. Patient verbalizes understanding and agrees with plan.   Mackenzie Lambert was evaluated in Emergency Department on 08/20/2021 for the symptoms described in the history of present illness. She was evaluated in the context of the global COVID-19 pandemic, which necessitated consideration that the patient might be at risk for infection with the SARS-CoV-2 virus that causes COVID-19. Institutional protocols and algorithms that pertain to the evaluation of patients at risk for COVID-19 are in a state of rapid change based on information released by regulatory bodies including the CDC and federal and state organizations. These policies and algorithms were followed during the patient's care in the ED.     MDM Rules/Calculators/A&P                         Patient with symptoms consistent with influenza/viral URI. Vitals are stable, low-grade fever. No signs of dehydration, tolerating PO's. Lungs are clear. Supportive therapy indicated with return if symptoms worsen. Patient counseled.     Final Clinical Impression(s) / ED Diagnoses Final diagnoses:  Influenza-like illness    Rx / DC Orders ED Discharge Orders     None        Renne Crigler, Cordelia Poche 08/20/21 1514    Gerhard Munch, MD 08/20/21 1523

## 2021-09-17 ENCOUNTER — Emergency Department (HOSPITAL_COMMUNITY)
Admission: EM | Admit: 2021-09-17 | Discharge: 2021-09-18 | Disposition: A | Discharge status: Home / Self Care | Payer: Medicaid Other | Attending: Emergency Medicine | Admitting: Emergency Medicine

## 2021-09-17 ENCOUNTER — Other Ambulatory Visit: Payer: Self-pay

## 2021-09-17 ENCOUNTER — Encounter (HOSPITAL_COMMUNITY): Payer: Self-pay | Admitting: Emergency Medicine

## 2021-09-17 DIAGNOSIS — Z20822 Contact with and (suspected) exposure to covid-19: Secondary | ICD-10-CM | POA: Insufficient documentation

## 2021-09-17 DIAGNOSIS — R059 Cough, unspecified: Secondary | ICD-10-CM | POA: Insufficient documentation

## 2021-09-17 DIAGNOSIS — Z5321 Procedure and treatment not carried out due to patient leaving prior to being seen by health care provider: Secondary | ICD-10-CM | POA: Diagnosis not present

## 2021-09-17 DIAGNOSIS — R0602 Shortness of breath: Secondary | ICD-10-CM | POA: Insufficient documentation

## 2021-09-17 DIAGNOSIS — R0981 Nasal congestion: Secondary | ICD-10-CM | POA: Insufficient documentation

## 2021-09-17 DIAGNOSIS — R112 Nausea with vomiting, unspecified: Secondary | ICD-10-CM | POA: Diagnosis not present

## 2021-09-17 LAB — RESP PANEL BY RT-PCR (FLU A&B, COVID) ARPGX2
Influenza A by PCR: NEGATIVE
Influenza B by PCR: NEGATIVE
SARS Coronavirus 2 by RT PCR: NEGATIVE

## 2021-09-17 NOTE — ED Provider Triage Note (Signed)
Emergency Medicine Provider Triage Evaluation Note  Mackenzie Lambert , a 26 y.o. female  was evaluated in triage.  Pt complains of nasal congestion, productive cough, vomiting for 3 days.  Patient states that her partner recently had a respiratory infection and she is afraid that she had the same thing.  She had 3 episodes of emesis yesterday, and 2 today, and she was sent home from work.  States that she has had some shortness of breath, worse after coughing and laying down at night.  Not at baseline.  Review of Systems  Positive: Nasal congestion, nausea, vomiting, productive cough, shortness of breath Negative: Fever, chills, abdominal pain, chest pain  Physical Exam  BP 118/69 (BP Location: Right Arm)    Pulse 82    Temp 98.2 F (36.8 C) (Oral)    Resp 18    Ht 5\' 1"  (1.549 m)    Wt 66.7 kg    SpO2 99%    BMI 27.78 kg/m  Gen:   Awake, no distress   Resp:  Normal effort  MSK:   Moves extremities without difficulty  Other:    Medical Decision Making  Medically screening exam initiated at 8:15 PM.  Appropriate orders placed.  Mackenzie Lambert was informed that the remainder of the evaluation will be completed by another provider, this initial triage assessment does not replace that evaluation, and the importance of remaining in the ED until their evaluation is complete.     [name], Su Monks 09/17/21 2041

## 2021-09-17 NOTE — ED Triage Notes (Signed)
Pt reported to ED with c/o URI symptoms. States son was recently diagnosed with URI 3 days ago.

## 2021-09-18 NOTE — ED Notes (Signed)
Pt called for vitals, no response. 

## 2023-07-27 ENCOUNTER — Other Ambulatory Visit (HOSPITAL_COMMUNITY): Payer: Self-pay

## 2023-07-27 MED ORDER — IBUPROFEN 600 MG PO TABS
600.0000 mg | ORAL_TABLET | Freq: Four times a day (QID) | ORAL | 0 refills | Status: AC | PRN
  Filled 2023-07-27: qty 30, 8d supply, fill #0

## 2023-07-27 MED ORDER — ACETAMINOPHEN-CODEINE 300-30 MG PO TABS
1.0000 | ORAL_TABLET | Freq: Four times a day (QID) | ORAL | 0 refills | Status: AC | PRN
  Filled 2023-07-27: qty 16, 4d supply, fill #0

## 2023-07-27 MED ORDER — AMOXICILLIN 500 MG PO CAPS
500.0000 mg | ORAL_CAPSULE | Freq: Three times a day (TID) | ORAL | 0 refills | Status: AC
  Filled 2023-07-27: qty 21, 7d supply, fill #0
